# Patient Record
Sex: Female | Born: 1980 | Race: Black or African American | Hispanic: No | Marital: Single | Smoking: Current every day smoker
Health system: Southern US, Community
[De-identification: ages and names within clinical notes are randomized; demographics above are authoritative.]

## PROBLEM LIST (undated history)

## (undated) ENCOUNTER — Inpatient Hospital Stay (HOSPITAL_COMMUNITY): Payer: Self-pay

## (undated) DIAGNOSIS — A549 Gonococcal infection, unspecified: Secondary | ICD-10-CM

## (undated) DIAGNOSIS — A599 Trichomoniasis, unspecified: Secondary | ICD-10-CM

## (undated) DIAGNOSIS — A749 Chlamydial infection, unspecified: Secondary | ICD-10-CM

## (undated) DIAGNOSIS — Z789 Other specified health status: Secondary | ICD-10-CM

---
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
## 2001-08-27 ENCOUNTER — Encounter: Payer: Self-pay | Admitting: Emergency Medicine

## 2001-08-27 ENCOUNTER — Emergency Department (HOSPITAL_COMMUNITY): Admission: EM | Admit: 2001-08-27 | Discharge: 2001-08-27 | Payer: Self-pay | Admitting: Emergency Medicine

## 2003-03-10 ENCOUNTER — Emergency Department (HOSPITAL_COMMUNITY): Admission: EM | Admit: 2003-03-10 | Discharge: 2003-03-10 | Payer: Self-pay

## 2003-03-14 ENCOUNTER — Emergency Department (HOSPITAL_COMMUNITY): Admission: EM | Admit: 2003-03-14 | Discharge: 2003-03-14 | Payer: Self-pay | Admitting: Emergency Medicine

## 2003-07-17 ENCOUNTER — Inpatient Hospital Stay (HOSPITAL_COMMUNITY): Admission: AD | Admit: 2003-07-17 | Discharge: 2003-07-17 | Payer: Self-pay | Admitting: Obstetrics and Gynecology

## 2004-06-29 ENCOUNTER — Emergency Department (HOSPITAL_COMMUNITY): Admission: EM | Admit: 2004-06-29 | Discharge: 2004-06-29 | Payer: Self-pay | Admitting: Emergency Medicine

## 2004-08-24 ENCOUNTER — Inpatient Hospital Stay (HOSPITAL_COMMUNITY): Admission: AD | Admit: 2004-08-24 | Discharge: 2004-08-24 | Payer: Self-pay | Admitting: *Deleted

## 2005-03-21 ENCOUNTER — Emergency Department (HOSPITAL_COMMUNITY): Admission: EM | Admit: 2005-03-21 | Discharge: 2005-03-21 | Payer: Self-pay | Admitting: *Deleted

## 2007-05-09 ENCOUNTER — Emergency Department (HOSPITAL_COMMUNITY): Admission: EM | Admit: 2007-05-09 | Discharge: 2007-05-09 | Payer: Self-pay | Admitting: Emergency Medicine

## 2007-12-01 ENCOUNTER — Emergency Department (HOSPITAL_COMMUNITY): Admission: EM | Admit: 2007-12-01 | Discharge: 2007-12-01 | Payer: Self-pay | Admitting: *Deleted

## 2008-01-12 ENCOUNTER — Emergency Department (HOSPITAL_COMMUNITY): Admission: EM | Admit: 2008-01-12 | Discharge: 2008-01-12 | Payer: Self-pay | Admitting: Emergency Medicine

## 2008-05-23 ENCOUNTER — Emergency Department (HOSPITAL_COMMUNITY): Admission: EM | Admit: 2008-05-23 | Discharge: 2008-05-23 | Payer: Self-pay | Admitting: Emergency Medicine

## 2008-05-24 ENCOUNTER — Emergency Department (HOSPITAL_COMMUNITY): Admission: EM | Admit: 2008-05-24 | Discharge: 2008-05-24 | Payer: Self-pay | Admitting: Emergency Medicine

## 2010-05-29 ENCOUNTER — Inpatient Hospital Stay (HOSPITAL_COMMUNITY): Admission: AD | Admit: 2010-05-29 | Discharge: 2010-05-29 | Payer: Self-pay | Admitting: Obstetrics and Gynecology

## 2010-06-09 ENCOUNTER — Inpatient Hospital Stay (HOSPITAL_COMMUNITY): Admission: AD | Admit: 2010-06-09 | Discharge: 2010-06-09 | Payer: Self-pay | Admitting: Obstetrics & Gynecology

## 2010-06-20 ENCOUNTER — Inpatient Hospital Stay (HOSPITAL_COMMUNITY): Admission: AD | Admit: 2010-06-20 | Discharge: 2010-06-20 | Payer: Self-pay | Admitting: Obstetrics & Gynecology

## 2010-06-21 ENCOUNTER — Ambulatory Visit: Payer: Self-pay | Admitting: Nurse Practitioner

## 2010-06-21 ENCOUNTER — Inpatient Hospital Stay (HOSPITAL_COMMUNITY): Admission: AD | Admit: 2010-06-21 | Discharge: 2010-06-21 | Payer: Self-pay | Admitting: Obstetrics & Gynecology

## 2011-01-31 LAB — WET PREP, GENITAL: Yeast Wet Prep HPF POC: NONE SEEN

## 2011-01-31 LAB — URINALYSIS, ROUTINE W REFLEX MICROSCOPIC
Nitrite: NEGATIVE
Specific Gravity, Urine: >1.03 — ABNORMAL HIGH (ref 1.005–1.030)
Urobilinogen, UA: 0.2 mg/dL (ref 0.0–1.0)

## 2011-01-31 LAB — CBC
Hemoglobin: 12.6 g/dL (ref 12.0–15.0)
Platelets: 150 10*3/uL (ref 150–400)
RBC: 4.25 MIL/uL (ref 3.87–5.11)

## 2011-01-31 LAB — URINE MICROSCOPIC-ADD ON

## 2011-02-01 LAB — WET PREP, GENITAL
Clue Cells Wet Prep HPF POC: NONE SEEN
Trich, Wet Prep: NONE SEEN

## 2011-02-02 LAB — WET PREP, GENITAL

## 2011-04-12 ENCOUNTER — Inpatient Hospital Stay (HOSPITAL_COMMUNITY)
Admission: AD | Admit: 2011-04-12 | Discharge: 2011-04-12 | Disposition: A | Discharge status: Home / Self Care | Payer: Self-pay | Source: Ambulatory Visit | Attending: Obstetrics and Gynecology | Admitting: Obstetrics and Gynecology

## 2011-04-12 DIAGNOSIS — N915 Oligomenorrhea, unspecified: Secondary | ICD-10-CM

## 2011-04-12 LAB — URINALYSIS, ROUTINE W REFLEX MICROSCOPIC
Bilirubin Urine: NEGATIVE
Glucose, UA: NEGATIVE mg/dL
Protein, ur: NEGATIVE mg/dL
Specific Gravity, Urine: 1.015 (ref 1.005–1.030)
pH: 8 (ref 5.0–8.0)

## 2011-09-03 LAB — I-STAT 8, (EC8 V) (CONVERTED LAB)
Acid-base deficit: 2
Bicarbonate: 22
Chloride: 107
Glucose, Bld: 102 — ABNORMAL HIGH
Hemoglobin: 13.9
Operator id: 192351
Potassium: 3.5
TCO2: 23
pH, Ven: 7.42 — ABNORMAL HIGH

## 2011-09-03 LAB — DIFFERENTIAL
Eosinophils Relative: 1
Lymphocytes Relative: 53 — ABNORMAL HIGH
Lymphs Abs: 3.1
Monocytes Absolute: 0.4

## 2011-09-03 LAB — URINALYSIS, ROUTINE W REFLEX MICROSCOPIC
Bilirubin Urine: NEGATIVE
Hgb urine dipstick: NEGATIVE
Ketones, ur: NEGATIVE
Nitrite: NEGATIVE
Protein, ur: NEGATIVE
Specific Gravity, Urine: 1.036 — ABNORMAL HIGH
Urobilinogen, UA: 0.2

## 2011-09-03 LAB — POCT I-STAT CREATININE: Operator id: 192351

## 2011-09-03 LAB — CBC
HCT: 37.2
Hemoglobin: 12.1
MCV: 86.4
RBC: 4.31
RDW: 13.3
WBC: 5.8

## 2011-11-24 ENCOUNTER — Inpatient Hospital Stay (HOSPITAL_COMMUNITY): Payer: Medicaid Other

## 2011-11-24 ENCOUNTER — Inpatient Hospital Stay (HOSPITAL_COMMUNITY)
Admission: AD | Admit: 2011-11-24 | Discharge: 2011-11-24 | Disposition: A | Discharge status: Home / Self Care | Payer: Medicaid Other | Source: Ambulatory Visit | Attending: Obstetrics & Gynecology | Admitting: Obstetrics & Gynecology

## 2011-11-24 ENCOUNTER — Encounter (HOSPITAL_COMMUNITY): Payer: Self-pay | Admitting: *Deleted

## 2011-11-24 DIAGNOSIS — Z3490 Encounter for supervision of normal pregnancy, unspecified, unspecified trimester: Secondary | ICD-10-CM

## 2011-11-24 DIAGNOSIS — Z1389 Encounter for screening for other disorder: Secondary | ICD-10-CM

## 2011-11-24 DIAGNOSIS — O99891 Other specified diseases and conditions complicating pregnancy: Secondary | ICD-10-CM | POA: Insufficient documentation

## 2011-11-24 DIAGNOSIS — R109 Unspecified abdominal pain: Secondary | ICD-10-CM | POA: Insufficient documentation

## 2011-11-24 HISTORY — DX: Chlamydial infection, unspecified: A74.9

## 2011-11-24 HISTORY — DX: Trichomoniasis, unspecified: A59.9

## 2011-11-24 HISTORY — DX: Gonococcal infection, unspecified: A54.9

## 2011-11-24 LAB — URINALYSIS, ROUTINE W REFLEX MICROSCOPIC
Bilirubin Urine: NEGATIVE
Leukocytes, UA: NEGATIVE
Nitrite: NEGATIVE
Protein, ur: NEGATIVE mg/dL
Specific Gravity, Urine: 1.02 (ref 1.005–1.030)
Urobilinogen, UA: 1 mg/dL (ref 0.0–1.0)

## 2011-11-24 LAB — CBC
Hemoglobin: 12.1 g/dL (ref 12.0–15.0)
MCHC: 33.2 g/dL (ref 30.0–36.0)
MCV: 86.5 fL (ref 78.0–100.0)
RBC: 4.22 MIL/uL (ref 3.87–5.11)
WBC: 6.5 10*3/uL (ref 4.0–10.5)

## 2011-11-24 LAB — ABO/RH: ABO/RH(D): AB POS

## 2011-11-24 NOTE — Progress Notes (Signed)
+   UPT on Sat, was positive, wanting confirmation.

## 2011-11-24 NOTE — ED Provider Notes (Signed)
History     Chief Complaint  Patient presents with  . Possible Pregnancy   HPI 31 y.o. G1P0 at [redacted]w[redacted]d with low abd pain, no bleeding or discharge.    Past Medical History  Diagnosis Date  . Chlamydia   . Gonorrhea   . Trichomonas     Past Surgical History  Procedure Date  . No past surgeries     Family History  Problem Relation Age of Onset  . Anesthesia problems Neg Hx     History  Substance Use Topics  . Smoking status: Current Everyday Smoker -- 0.2 packs/day for 10 years  . Smokeless tobacco: Never Used  . Alcohol Use: Yes     occ    Allergies: No Known Allergies  No prescriptions prior to admission    Review of Systems  Constitutional: Negative.   Respiratory: Negative.   Cardiovascular: Negative.   Gastrointestinal: Positive for abdominal pain. Negative for nausea, vomiting, diarrhea and constipation.  Genitourinary: Negative for dysuria, urgency, frequency, hematuria and flank pain.       Negative for vaginal bleeding, vaginal discharge  Musculoskeletal: Negative.   Neurological: Negative.   Psychiatric/Behavioral: Negative.    Physical Exam   Blood pressure 115/73, pulse 83, temperature 99.5 F (37.5 C), temperature source Oral, resp. rate 18, height 5\' 1"  (1.549 m), weight 167 lb (75.751 kg), last menstrual period 10/18/2011.  Physical Exam  Nursing note and vitals reviewed. Constitutional: She is oriented to person, place, and time. She appears well-developed and well-nourished. No distress.  Cardiovascular: Normal rate.   Respiratory: Effort normal.  GI: Soft. There is no tenderness.  Musculoskeletal: Normal range of motion.  Neurological: She is alert and oriented to person, place, and time.  Skin: Skin is warm and dry.  Psychiatric: She has a normal mood and affect.    MAU Course  Procedures  Results for orders placed during the hospital encounter of 11/24/11 (from the past 48 hour(s))  URINALYSIS, ROUTINE W REFLEX MICROSCOPIC      Status: Normal   Collection Time   11/24/11  4:00 PM      Component Value Range Comment   Color, Urine YELLOW  YELLOW     APPearance CLEAR  CLEAR     Specific Gravity, Urine 1.020  1.005 - 1.030     pH 7.5  5.0 - 8.0     Glucose, UA NEGATIVE  NEGATIVE (mg/dL)    Hgb urine dipstick NEGATIVE  NEGATIVE     Bilirubin Urine NEGATIVE  NEGATIVE     Ketones, ur NEGATIVE  NEGATIVE (mg/dL)    Protein, ur NEGATIVE  NEGATIVE (mg/dL)    Urobilinogen, UA 1.0  0.0 - 1.0 (mg/dL)    Nitrite NEGATIVE  NEGATIVE     Leukocytes, UA NEGATIVE  NEGATIVE  MICROSCOPIC NOT DONE ON URINES WITH NEGATIVE PROTEIN, BLOOD, LEUKOCYTES, NITRITE, OR GLUCOSE <1000 mg/dL.  POCT PREGNANCY, URINE     Status: Normal   Collection Time   11/24/11  4:20 PM      Component Value Range Comment   Preg Test, Ur POSITIVE     CBC     Status: Normal   Collection Time   11/24/11  4:32 PM      Component Value Range Comment   WBC 6.5  4.0 - 10.5 (K/uL)    RBC 4.22  3.87 - 5.11 (MIL/uL)    Hemoglobin 12.1  12.0 - 15.0 (g/dL)    HCT 16.1  09.6 - 04.5 (%)  MCV 86.5  78.0 - 100.0 (fL)    MCH 28.7  26.0 - 34.0 (pg)    MCHC 33.2  30.0 - 36.0 (g/dL)    RDW 16.1  09.6 - 04.5 (%)    Platelets 167  150 - 400 (K/uL)   ABO/RH     Status: Normal   Collection Time   11/24/11  4:32 PM      Component Value Range Comment   ABO/RH(D) AB POS     HCG, QUANTITATIVE, PREGNANCY     Status: Abnormal   Collection Time   11/24/11  4:32 PM      Component Value Range Comment   hCG, Beta Chain, Quant, S 9990 (*) <5 (mIU/mL)     US Ob Comp Less 14 Wks  11/24/2011  *RADIOLOGY REPORT*  Clinical Data: Abdominal pain  OBSTETRIC <14 WK [place] AND TRANSVAGINAL OB US  Technique:  Both transabdominal and transvaginal ultrasound examinations were performed for complete evaluation of the gestation as well as the maternal uterus, adnexal regions, and pelvic cul-de-sac.  Transvaginal technique was performed to assess early pregnancy.  Comparison:  None.  Intrauterine  gestational sac:  Present and single Yolk sac: Present Embryo: Present Cardiac Activity: Identified Heart Rate: Not measurable  MSD: 9  mm  5         w 4     d  Maternal uterus/adnexae: Normal ovaries.  No subchorionic hemorrhage.  Trace free fluid.  IMPRESSION: Single uterine gestation with embryo and perceivable but not measurable cardiac activity.  Estimated gestational age by mean sac diameter equals 5 weeks 4 days.  Original Report Authenticated By: Genevive Bi, M.D.   US Ob Transvaginal  11/24/2011  *RADIOLOGY REPORT*  Clinical Data: Abdominal pain  OBSTETRIC <14 WK [place] AND TRANSVAGINAL OB US  Technique:  Both transabdominal and transvaginal ultrasound examinations were performed for complete evaluation of the gestation as well as the maternal uterus, adnexal regions, and pelvic cul-de-sac.  Transvaginal technique was performed to assess early pregnancy.  Comparison:  None.  Intrauterine gestational sac:  Present and single Yolk sac: Present Embryo: Present Cardiac Activity: Identified Heart Rate: Not measurable  MSD: 9  mm  5         w 4     d  Maternal uterus/adnexae: Normal ovaries.  No subchorionic hemorrhage.  Trace free fluid.  IMPRESSION: Single uterine gestation with embryo and perceivable but not measurable cardiac activity.  Estimated gestational age by mean sac diameter equals 5 weeks 4 days.  Original Report Authenticated By: Genevive Bi, M.D.   Pt declined pelvic exam.  Assessment and Plan  31 y.o. G1P0 at [redacted]w[redacted]d Start prenatal care ASAP Pregnancy verification and list of care providers given  [facility] 11/25/2011, 10:43 PM

## 2011-11-26 NOTE — ED Provider Notes (Signed)
Agree with above note.  [name] [name] 11/26/2011 8:52 AM

## 2012-01-28 ENCOUNTER — Other Ambulatory Visit: Payer: Self-pay

## 2012-01-28 ENCOUNTER — Encounter (HOSPITAL_COMMUNITY): Payer: Self-pay | Admitting: *Deleted

## 2012-01-28 ENCOUNTER — Inpatient Hospital Stay (HOSPITAL_COMMUNITY)
Admission: AD | Admit: 2012-01-28 | Discharge: 2012-01-28 | Disposition: A | Discharge status: Home / Self Care | Payer: Medicaid Other | Source: Ambulatory Visit | Attending: Obstetrics & Gynecology | Admitting: Obstetrics & Gynecology

## 2012-01-28 DIAGNOSIS — O219 Vomiting of pregnancy, unspecified: Secondary | ICD-10-CM

## 2012-01-28 DIAGNOSIS — R05 Cough: Secondary | ICD-10-CM | POA: Insufficient documentation

## 2012-01-28 DIAGNOSIS — O99891 Other specified diseases and conditions complicating pregnancy: Secondary | ICD-10-CM | POA: Insufficient documentation

## 2012-01-28 DIAGNOSIS — J40 Bronchitis, not specified as acute or chronic: Secondary | ICD-10-CM | POA: Insufficient documentation

## 2012-01-28 DIAGNOSIS — R51 Headache: Secondary | ICD-10-CM | POA: Insufficient documentation

## 2012-01-28 DIAGNOSIS — R059 Cough, unspecified: Secondary | ICD-10-CM | POA: Insufficient documentation

## 2012-01-28 DIAGNOSIS — O99330 Smoking (tobacco) complicating pregnancy, unspecified trimester: Secondary | ICD-10-CM

## 2012-01-28 DIAGNOSIS — J069 Acute upper respiratory infection, unspecified: Secondary | ICD-10-CM | POA: Insufficient documentation

## 2012-01-28 LAB — URINALYSIS, ROUTINE W REFLEX MICROSCOPIC
Bilirubin Urine: NEGATIVE
Glucose, UA: NEGATIVE mg/dL
Hgb urine dipstick: NEGATIVE
Ketones, ur: NEGATIVE mg/dL
Protein, ur: NEGATIVE mg/dL

## 2012-01-28 LAB — CBC
HCT: 32.8 % — ABNORMAL LOW (ref 36.0–46.0)
Hemoglobin: 10.9 g/dL — ABNORMAL LOW (ref 12.0–15.0)
MCH: 28.4 pg (ref 26.0–34.0)
MCHC: 33.2 g/dL (ref 30.0–36.0)
MCV: 85.4 fL (ref 78.0–100.0)

## 2012-01-28 MED ORDER — ALBUTEROL SULFATE HFA 108 (90 BASE) MCG/ACT IN AERS: 2.0000 | INHALATION_SPRAY | RESPIRATORY_TRACT | Status: DC

## 2012-01-28 MED ORDER — GUAIFENESIN ER 600 MG PO TB12: 1200.0000 mg | ORAL_TABLET | Freq: Two times a day (BID) | ORAL | Status: DC

## 2012-01-28 MED ORDER — LORATADINE 10 MG PO TABS: 10.0000 mg | ORAL_TABLET | Freq: Every day | ORAL | Status: DC

## 2012-01-28 NOTE — MAU Note (Signed)
Pt G1 at 14.4wks reports runny nose, cough and headache x 2 days.  Yesterday dizziness, today feels like heart was "skipping beats."

## 2012-01-28 NOTE — MAU Provider Note (Signed)
  History     CSN: [id number]  Arrival date and time: 01/28/12 2013   First Provider Initiated Contact with Patient 01/28/12 2051      Chief Complaint  Patient presents with  . Cough  . Headache  . Nasal Congestion  . Dizziness   HPI 31 y.o. G1P0 at [redacted]w[redacted]d. Cough and headache x 2 days, no fever. Dizzy yesterday, felt like heart was skipping beats today. Also c/o nausea and vomiting every night after getting off work.    Past Medical History  Diagnosis Date  . Chlamydia   . Gonorrhea   . Trichomonas     Past Surgical History  Procedure Date  . No past surgeries     Family History  Problem Relation Age of Onset  . Anesthesia problems Neg Hx     History  Substance Use Topics  . Smoking status: Current Everyday Smoker -- 0.2 packs/day for 10 years  . Smokeless tobacco: Never Used  . Alcohol Use: Yes     occ    Allergies: No Known Allergies  No prescriptions prior to admission    Review of Systems  Constitutional: Negative.  Negative for fever and chills.  HENT: Negative for sore throat.   Respiratory: Positive for cough and sputum production. Negative for shortness of breath and stridor.   Cardiovascular: Negative.  Negative for chest pain and palpitations.  Gastrointestinal: Negative for nausea, vomiting, abdominal pain, diarrhea and constipation.  Genitourinary: Negative for dysuria, urgency, frequency, hematuria and flank pain.       Negative for vaginal bleeding, vaginal discharge  Musculoskeletal: Negative.   Neurological: Positive for headaches. Negative for loss of consciousness.  Psychiatric/Behavioral: Negative.    Physical Exam   Blood pressure 116/72, pulse 69, temperature 97.4 F (36.3 C), resp. rate 16, height 5' (1.524 m), weight 169 lb (76.658 kg), last menstrual period 10/18/2011, SpO2 100.00%.  Physical Exam  Nursing note and vitals reviewed. Constitutional: She appears well-developed and well-nourished. No distress.  HENT:  Head:  Normocephalic and atraumatic.  Neck: Normal range of motion.  Cardiovascular: Normal rate and normal heart sounds.  A regularly irregular rhythm present. Exam reveals no gallop and no friction rub.   No murmur heard. Respiratory: Effort normal. No stridor. No respiratory distress. She has wheezes (mild expiratory, left lower lobe).  GI: Soft. There is no tenderness.  Lymphadenopathy:    She has no cervical adenopathy.    MAU Course  Procedures  EKG: normal EKG, normal sinus rhythm, sinus arrhythmia.   Assessment and Plan  31 y.o. G1P0 at [redacted]w[redacted]d URI/Bronchitis - Mucinex, Claritin, Albuterol inhaler prescribed, recommended smoking cessation Normal EKG with sinus arrhythmia  Has first prenatal appt with Dr. Gaynell Face on Monday, rev'd precautions, f/u as scheduled or PRN  [name] [name] 01/28/2012, 9:06 PM

## 2012-02-14 ENCOUNTER — Inpatient Hospital Stay (HOSPITAL_COMMUNITY)
Admission: AD | Admit: 2012-02-14 | Discharge: 2012-02-14 | Disposition: A | Discharge status: Home / Self Care | Payer: Medicaid Other | Source: Ambulatory Visit | Attending: Obstetrics & Gynecology | Admitting: Obstetrics & Gynecology

## 2012-02-14 ENCOUNTER — Encounter (HOSPITAL_COMMUNITY): Payer: Self-pay

## 2012-02-14 DIAGNOSIS — R51 Headache: Secondary | ICD-10-CM | POA: Insufficient documentation

## 2012-02-14 DIAGNOSIS — O210 Mild hyperemesis gravidarum: Secondary | ICD-10-CM | POA: Insufficient documentation

## 2012-02-14 DIAGNOSIS — O99891 Other specified diseases and conditions complicating pregnancy: Secondary | ICD-10-CM | POA: Insufficient documentation

## 2012-02-14 LAB — URINALYSIS, ROUTINE W REFLEX MICROSCOPIC
Glucose, UA: NEGATIVE mg/dL
Ketones, ur: NEGATIVE mg/dL
Leukocytes, UA: NEGATIVE
pH: 7.5 (ref 5.0–8.0)

## 2012-02-14 MED ORDER — DEXAMETHASONE SODIUM PHOSPHATE 10 MG/ML IJ SOLN
10.0000 mg | Freq: Once | INTRAMUSCULAR | Status: AC
  Administered 2012-02-14: 10 mg via INTRAVENOUS
  Filled 2012-02-14: qty 1

## 2012-02-14 MED ORDER — LACTATED RINGERS IV BOLUS (SEPSIS)
1000.0000 mL | Freq: Once | INTRAVENOUS | Status: AC
  Administered 2012-02-14: 1000 mL via INTRAVENOUS

## 2012-02-14 MED ORDER — BUTALBITAL-APAP-CAFFEINE 50-325-40 MG PO TABS: 1.0000 | ORAL_TABLET | ORAL | Status: DC | PRN

## 2012-02-14 MED ORDER — METOCLOPRAMIDE HCL 5 MG/ML IJ SOLN
10.0000 mg | Freq: Once | INTRAMUSCULAR | Status: AC
  Administered 2012-02-14: 10 mg via INTRAVENOUS
  Filled 2012-02-14: qty 2

## 2012-02-14 MED ORDER — DIPHENHYDRAMINE HCL 50 MG/ML IJ SOLN
25.0000 mg | Freq: Once | INTRAMUSCULAR | Status: AC
  Administered 2012-02-14: 25 mg via INTRAVENOUS
  Filled 2012-02-14: qty 1

## 2012-02-14 MED ORDER — BUTALBITAL-APAP-CAFFEINE 50-325-40 MG PO TABS: 1.0000 | ORAL_TABLET | ORAL | Status: AC | PRN

## 2012-02-14 NOTE — Discharge Instructions (Signed)

## 2012-02-14 NOTE — MAU Note (Signed)
Onset of headache since this morning [redacted] weeks gestation having headache daily, N/V once or twice a day.

## 2012-02-14 NOTE — ED Provider Notes (Signed)
First Provider Initiated Contact with Patient 02/14/12 1608      Chief Complaint:  Headache   [name] [name] is  31 y.o. G1P0 at [redacted]w[redacted]d presents complaining of Headache She gets a headache daily after vomiting in the morning and it usually goes away, but today it has not.  She has not taken any medicines for it.  It is the same HA she gets most days  Menstrual History: OB History    Grav Para Term Preterm Abortions TAB SAB Ect Mult Living   1              Patient's last menstrual period was 10/18/2011.     Past Medical History: Past Medical History  Diagnosis Date  . Chlamydia   . Gonorrhea   . Trichomonas     Past Surgical History: History reviewed. No pertinent past surgical history.  Family History: Family History  Problem Relation Age of Onset  . Anesthesia problems Neg Hx     Social History: History  Substance Use Topics  . Smoking status: Current Everyday Smoker -- 0.2 packs/day for 10 years  . Smokeless tobacco: Never Used  . Alcohol Use: No     occ    Allergies: No Known Allergies  Meds:  Prescriptions prior to admission  Medication Sig Dispense Refill  . albuterol (PROVENTIL HFA;VENTOLIN HFA) 108 (90 BASE) MCG/ACT inhaler Inhale 2 puffs into the lungs every 4 (four) hours.  1 Inhaler  0  . guaiFENesin (MUCINEX) 600 MG 12 hr tablet Take 2 tablets (1,200 mg total) by mouth 2 (two) times daily.  30 tablet  0  . loratadine (CLARITIN) 10 MG tablet Take 1 tablet (10 mg total) by mouth daily.  30 tablet  1    Review of Systems - Please refer to the aforementioned patients' reports.     Physical Exam  Blood pressure 107/69, pulse 89, temperature 98 F (36.7 C), resp. rate 16, height 5' (1.524 m), weight 75.66 kg (166 lb 12.8 oz), last menstrual period 10/18/2011. GENERAL: Well-developed, well-nourished female in no acute distress.  LUNGS: Clear to auscultation bilaterally.  HEART: Regular rate and rhythm. ABDOMEN: Soft, nontender, nondistended, gravid.    EXTREMITIES: Nontender, no edema, 2+ distal pulses. FHT confirmed in the 140's   Labs: Recent Results (from the past 24 hour(s))  URINALYSIS, ROUTINE W REFLEX MICROSCOPIC   Collection Time   02/14/12  3:50 PM      Component Value Range   Color, Urine YELLOW  YELLOW    APPearance HAZY (*) CLEAR    Specific Gravity, Urine 1.020  1.005 - 1.030    pH 7.5  5.0 - 8.0    Glucose, UA NEGATIVE  NEGATIVE (mg/dL)   Hgb urine dipstick NEGATIVE  NEGATIVE    Bilirubin Urine NEGATIVE  NEGATIVE    Ketones, ur NEGATIVE  NEGATIVE (mg/dL)   Protein, ur NEGATIVE  NEGATIVE (mg/dL)   Urobilinogen, UA 0.2  0.0 - 1.0 (mg/dL)   Nitrite NEGATIVE  NEGATIVE    Leukocytes, UA NEGATIVE  NEGATIVE    Imaging Studies:  No results found.  Assessment: [name] [name] is  31 y.o. G1P0 at [redacted]w[redacted]d presents with headache, resolved completely after meds/IVF  Plan: D/C home  [name],[name] Silveri 3/30/20134:08 PM

## 2012-02-14 NOTE — MAU Note (Signed)
[name] [name] at bedside with pt. Plan of care discussed.

## 2012-06-04 ENCOUNTER — Inpatient Hospital Stay (HOSPITAL_COMMUNITY)
Admission: AD | Admit: 2012-06-04 | Discharge: 2012-06-04 | Disposition: A | Discharge status: Home / Self Care | Payer: Medicaid Other | Source: Ambulatory Visit | Attending: Obstetrics | Admitting: Obstetrics

## 2012-06-04 ENCOUNTER — Encounter (HOSPITAL_COMMUNITY): Payer: Self-pay | Admitting: *Deleted

## 2012-06-04 DIAGNOSIS — N949 Unspecified condition associated with female genital organs and menstrual cycle: Secondary | ICD-10-CM | POA: Insufficient documentation

## 2012-06-04 DIAGNOSIS — O2390 Unspecified genitourinary tract infection in pregnancy, unspecified trimester: Secondary | ICD-10-CM | POA: Insufficient documentation

## 2012-06-04 DIAGNOSIS — A499 Bacterial infection, unspecified: Secondary | ICD-10-CM | POA: Insufficient documentation

## 2012-06-04 DIAGNOSIS — N760 Acute vaginitis: Secondary | ICD-10-CM | POA: Insufficient documentation

## 2012-06-04 DIAGNOSIS — B9689 Other specified bacterial agents as the cause of diseases classified elsewhere: Secondary | ICD-10-CM | POA: Insufficient documentation

## 2012-06-04 LAB — URINALYSIS, ROUTINE W REFLEX MICROSCOPIC
Bilirubin Urine: NEGATIVE
Nitrite: NEGATIVE
Specific Gravity, Urine: 1.02 (ref 1.005–1.030)
pH: 6 (ref 5.0–8.0)

## 2012-06-04 LAB — URINE MICROSCOPIC-ADD ON

## 2012-06-04 LAB — WET PREP, GENITAL

## 2012-06-04 MED ORDER — METRONIDAZOLE 500 MG PO TABS: 500.0000 mg | ORAL_TABLET | Freq: Two times a day (BID) | ORAL | Status: AC

## 2012-06-04 MED ORDER — FLUCONAZOLE 150 MG PO TABS: 150.0000 mg | ORAL_TABLET | Freq: Once | ORAL | Status: DC

## 2012-06-04 NOTE — MAU Provider Note (Signed)
History     CSN: [id number]  Arrival date and time: 06/04/12 1041   First Provider Initiated Contact with Patient 06/04/12 1150      Chief Complaint  Patient presents with  . Vaginal Pain   HPI  Patient states she has had vaginal irritation with burning when urine hits it. No bleeding, no discharge. Patient reports good fetal movement and no contractions.   Past Medical History  Diagnosis Date  . Chlamydia   . Gonorrhea   . Trichomonas     History reviewed. No pertinent past surgical history.  Family History  Problem Relation Age of Onset  . Anesthesia problems Neg Hx   . Diabetes Mother   . Heart disease Father     History  Substance Use Topics  . Smoking status: Current Everyday Smoker -- 0.2 packs/day for 10 years  . Smokeless tobacco: Never Used  . Alcohol Use: No     occ    Allergies: No Known Allergies  Prescriptions prior to admission  Medication Sig Dispense Refill  . acetaminophen-codeine (TYLENOL #3) 300-30 MG per tablet Take 1 tablet by mouth every 4 (four) hours as needed. For pain      . OVER THE COUNTER MEDICATION Take 1 tablet by mouth daily. Patient takes over the counter Iron      . Prenatal Vit-Fe Fumarate-FA (PRENATAL MULTIVITAMIN) TABS Take 1 tablet by mouth daily.        Review of Systems  Genitourinary: Positive for dysuria.       Vaginal irritation  All other systems reviewed and are negative.   Physical Exam   Blood pressure 102/64, pulse 88, temperature 99.1 F (37.3 C), temperature source Oral, resp. rate 16, height 4\' 11"  (1.499 m), weight 77.111 kg (170 lb), last menstrual period 10/18/2011, SpO2 100.00%.  Physical Exam  Constitutional: She is oriented to person, place, and time. She appears well-developed and well-nourished. No distress.  HENT:  Head: Normocephalic.  Neck: Normal range of motion. Neck supple.  Cardiovascular: Normal rate, regular rhythm and normal heart sounds.   Respiratory: Effort normal and breath  sounds normal.  Genitourinary: No bleeding around the vagina. Vaginal discharge found.       Vaginal redness; white discharge; cervix closed  Neurological: She is alert and oriented to person, place, and time.  Skin: Skin is warm and dry.    MAU Course  Procedures Results for orders placed during the hospital encounter of 06/04/12 (from the past 24 hour(s))  URINALYSIS, ROUTINE W REFLEX MICROSCOPIC     Status: Abnormal   Collection Time   06/04/12 10:55 AM      Component Value Range   Color, Urine YELLOW  YELLOW   APPearance HAZY (*) CLEAR   Specific Gravity, Urine 1.020  1.005 - 1.030   pH 6.0  5.0 - 8.0   Glucose, UA NEGATIVE  NEGATIVE mg/dL   Hgb urine dipstick NEGATIVE  NEGATIVE   Bilirubin Urine NEGATIVE  NEGATIVE   Ketones, ur NEGATIVE  NEGATIVE mg/dL   Protein, ur NEGATIVE  NEGATIVE mg/dL   Urobilinogen, UA 0.2  0.0 - 1.0 mg/dL   Nitrite NEGATIVE  NEGATIVE   Leukocytes, UA TRACE (*) NEGATIVE  URINE MICROSCOPIC-ADD ON     Status: Abnormal   Collection Time   06/04/12 10:55 AM      Component Value Range   Squamous Epithelial / LPF MANY (*) RARE   WBC, UA 0-2  <3 WBC/hpf   Urine-Other MUCOUS PRESENT  WET PREP, GENITAL     Status: Abnormal   Collection Time   06/04/12 12:00 PM      Component Value Range   Yeast Wet Prep HPF POC NONE SEEN  NONE SEEN   Trich, Wet Prep NONE SEEN  NONE SEEN   Clue Cells Wet Prep HPF POC MODERATE (*) NONE SEEN   WBC, Wet Prep HPF POC FEW (*) NONE SEEN   FHR 120's, +accels, reactive Toco - irritability  Assessment and Plan  Bacterial Vaginosis  Plan: DC to home RX Flagyl   [employer] 06/04/2012, 11:51 AM

## 2012-06-04 NOTE — MAU Note (Signed)
Patient states she has had vaginal irritation with burning when urine hits it. No bleeding, no discharge. Patient reports good fetal movement and no contractions.

## 2012-06-30 LAB — OB RESULTS CONSOLE GBS: GBS: NEGATIVE

## 2012-07-12 ENCOUNTER — Encounter (HOSPITAL_COMMUNITY): Payer: Self-pay | Admitting: *Deleted

## 2012-07-12 ENCOUNTER — Inpatient Hospital Stay (HOSPITAL_COMMUNITY)
Admission: AD | Admit: 2012-07-12 | Discharge: 2012-07-12 | Disposition: A | Discharge status: Home / Self Care | Payer: Medicaid Other | Source: Ambulatory Visit | Attending: Obstetrics | Admitting: Obstetrics

## 2012-07-12 DIAGNOSIS — O479 False labor, unspecified: Secondary | ICD-10-CM | POA: Insufficient documentation

## 2012-07-12 NOTE — MAU Note (Signed)
Contractions started this morning.  No bleeding or leaking.  No complications beyond anemia with preg.

## 2012-07-14 ENCOUNTER — Encounter (HOSPITAL_COMMUNITY): Payer: Self-pay | Admitting: Anesthesiology

## 2012-07-14 ENCOUNTER — Inpatient Hospital Stay (HOSPITAL_COMMUNITY)
Admission: AD | Admit: 2012-07-14 | Discharge: 2012-07-18 | DRG: 765 | Disposition: A | Discharge status: Home / Self Care | Payer: Medicaid Other | Source: Ambulatory Visit | Attending: Obstetrics | Admitting: Obstetrics

## 2012-07-14 ENCOUNTER — Encounter (HOSPITAL_COMMUNITY): Payer: Self-pay | Admitting: *Deleted

## 2012-07-14 DIAGNOSIS — Z98891 History of uterine scar from previous surgery: Secondary | ICD-10-CM

## 2012-07-14 DIAGNOSIS — O4590 Premature separation of placenta, unspecified, unspecified trimester: Secondary | ICD-10-CM | POA: Diagnosis present

## 2012-07-14 LAB — CBC
MCH: 28.2 pg (ref 26.0–34.0)
MCV: 86.6 fL (ref 78.0–100.0)
Platelets: 166 10*3/uL (ref 150–400)
RBC: 3.97 MIL/uL (ref 3.87–5.11)
RDW: 13.8 % (ref 11.5–15.5)

## 2012-07-14 LAB — OB RESULTS CONSOLE HIV ANTIBODY (ROUTINE TESTING): HIV: NONREACTIVE

## 2012-07-14 LAB — TYPE AND SCREEN

## 2012-07-14 MED ORDER — OXYCODONE-ACETAMINOPHEN 5-325 MG PO TABS: 1.0000 | ORAL_TABLET | ORAL | Status: DC | PRN

## 2012-07-14 MED ORDER — IBUPROFEN 600 MG PO TABS: 600.0000 mg | ORAL_TABLET | Freq: Four times a day (QID) | ORAL | Status: DC | PRN

## 2012-07-14 MED ORDER — EPHEDRINE 5 MG/ML INJ
10.0000 mg | INTRAVENOUS | Status: DC | PRN
  Filled 2012-07-14: qty 4

## 2012-07-14 MED ORDER — CITRIC ACID-SODIUM CITRATE 334-500 MG/5ML PO SOLN
30.0000 mL | ORAL | Status: DC | PRN
  Administered 2012-07-15: 30 mL via ORAL
  Filled 2012-07-14: qty 15

## 2012-07-14 MED ORDER — DIPHENHYDRAMINE HCL 50 MG/ML IJ SOLN: 12.5000 mg | INTRAMUSCULAR | Status: DC | PRN

## 2012-07-14 MED ORDER — EPHEDRINE 5 MG/ML INJ: 10.0000 mg | INTRAVENOUS | Status: DC | PRN

## 2012-07-14 MED ORDER — FENTANYL 2.5 MCG/ML BUPIVACAINE 1/10 % EPIDURAL INFUSION (WH - ANES)
14.0000 mL/h | INTRAMUSCULAR | Status: DC
  Administered 2012-07-15 (×4): 14 mL/h via EPIDURAL
  Filled 2012-07-14 (×5): qty 60

## 2012-07-14 MED ORDER — LACTATED RINGERS IV SOLN
500.0000 mL | INTRAVENOUS | Status: DC | PRN
  Administered 2012-07-14: 1000 mL via INTRAVENOUS

## 2012-07-14 MED ORDER — PHENYLEPHRINE 40 MCG/ML (10ML) SYRINGE FOR IV PUSH (FOR BLOOD PRESSURE SUPPORT): 80.0000 ug | PREFILLED_SYRINGE | INTRAVENOUS | Status: DC | PRN

## 2012-07-14 MED ORDER — FENTANYL 2.5 MCG/ML BUPIVACAINE 1/10 % EPIDURAL INFUSION (WH - ANES)
INTRAMUSCULAR | Status: DC | PRN
  Administered 2012-07-14: 12 mL/h via EPIDURAL

## 2012-07-14 MED ORDER — OXYTOCIN BOLUS FROM INFUSION
250.0000 mL | Freq: Once | INTRAVENOUS | Status: DC
  Filled 2012-07-14: qty 500

## 2012-07-14 MED ORDER — LIDOCAINE HCL (PF) 1 % IJ SOLN
INTRAMUSCULAR | Status: DC | PRN
  Administered 2012-07-14 (×2): 4 mL

## 2012-07-14 MED ORDER — TERBUTALINE SULFATE 1 MG/ML IJ SOLN
0.2500 mg | Freq: Once | INTRAMUSCULAR | Status: AC | PRN
  Filled 2012-07-14: qty 1

## 2012-07-14 MED ORDER — PHENYLEPHRINE 40 MCG/ML (10ML) SYRINGE FOR IV PUSH (FOR BLOOD PRESSURE SUPPORT)
80.0000 ug | PREFILLED_SYRINGE | INTRAVENOUS | Status: DC | PRN
  Filled 2012-07-14: qty 5

## 2012-07-14 MED ORDER — LIDOCAINE HCL (PF) 1 % IJ SOLN: 30.0000 mL | INTRAMUSCULAR | Status: DC | PRN

## 2012-07-14 MED ORDER — FLEET ENEMA 7-19 GM/118ML RE ENEM: 1.0000 | ENEMA | RECTAL | Status: DC | PRN

## 2012-07-14 MED ORDER — OXYTOCIN 40 UNITS IN LACTATED RINGERS INFUSION - SIMPLE MED: 62.5000 mL/h | Freq: Once | INTRAVENOUS | Status: DC

## 2012-07-14 MED ORDER — LACTATED RINGERS IV SOLN: 500.0000 mL | Freq: Once | INTRAVENOUS | Status: DC

## 2012-07-14 MED ORDER — ACETAMINOPHEN 325 MG PO TABS
650.0000 mg | ORAL_TABLET | ORAL | Status: DC | PRN
  Administered 2012-07-15 (×3): 650 mg via ORAL
  Filled 2012-07-14 (×3): qty 2

## 2012-07-14 MED ORDER — OXYTOCIN 40 UNITS IN LACTATED RINGERS INFUSION - SIMPLE MED
1.0000 m[IU]/min | INTRAVENOUS | Status: DC
  Filled 2012-07-14: qty 1000

## 2012-07-14 MED ORDER — BUTORPHANOL TARTRATE 1 MG/ML IJ SOLN
1.0000 mg | INTRAMUSCULAR | Status: DC | PRN
  Administered 2012-07-14: 1 mg via INTRAVENOUS
  Filled 2012-07-14: qty 1

## 2012-07-14 MED ORDER — LACTATED RINGERS IV SOLN
INTRAVENOUS | Status: DC
  Administered 2012-07-14 – 2012-07-15 (×3): via INTRAVENOUS

## 2012-07-14 MED ORDER — ONDANSETRON HCL 4 MG/2ML IJ SOLN
4.0000 mg | Freq: Four times a day (QID) | INTRAMUSCULAR | Status: DC | PRN
  Administered 2012-07-15 (×2): 4 mg via INTRAVENOUS
  Filled 2012-07-14 (×2): qty 2

## 2012-07-14 NOTE — H&P (Signed)
This is Dr. Francoise Ceo dictating the history and physical on [name] [name] she's a 31 year old gravida 1 at 30 weeks and 5 days EDC 07/24/2012 with ruptured membranes in labor cervix 3 cm 60% vertex -3 fluids clear she is on low-dose Pitocin was irregular contractions Past medical history negative Past surgical history negative Social history noncontributory System review negative Physical exam well-developed female in early labor HEENT negative Breasts negative Heart regular rhythm no murmurs no gallops Lungs clear to P&A abdomen Abdomen term Pelvic as described above Extremities negative and

## 2012-07-14 NOTE — MAU Note (Signed)
Pt C/O SROM @ 1730, clear fluid, has been having uc's all day, saw Dr. Gaynell Face today, was told she was closed.  Pt denies bleeding.

## 2012-07-14 NOTE — Anesthesia Preprocedure Evaluation (Addendum)
Anesthesia Evaluation  Patient identified by MRN, date of birth, ID band Patient awake    Reviewed: Allergy & Precautions, H&P , Patient's Chart, lab work & pertinent test results  Airway Mallampati: III TM Distance: >3 FB Neck ROM: full    Dental No notable dental hx. (+) Teeth Intact   Pulmonary neg pulmonary ROS,  breath sounds clear to auscultation  Pulmonary exam normal       Cardiovascular hypertension (PIH on magnesium), Rhythm:regular Rate:Normal     Neuro/Psych negative neurological ROS  negative psych ROS   GI/Hepatic negative GI ROS, Neg liver ROS,   Endo/Other  Morbid obesity  Renal/GU negative Renal ROS  negative genitourinary   Musculoskeletal   Abdominal Normal abdominal exam  (+)   Peds  Hematology negative hematology ROS (+)   Anesthesia Other Findings   Reproductive/Obstetrics (+) Pregnancy (FTp and possible abruption for c/s)                          Anesthesia Physical Anesthesia Plan  ASA: II and Emergent  Anesthesia Plan: Epidural   Post-op Pain Management:    Induction:   Airway Management Planned:   Additional Equipment:   Intra-op Plan:   Post-operative Plan:   Informed Consent: I have reviewed the patients History and Physical, chart, labs and discussed the procedure including the risks, benefits and alternatives for the proposed anesthesia with the patient or authorized representative who has indicated his/her understanding and acceptance.     Plan Discussed with: Anesthesiologist, Surgeon and CRNA  Anesthesia Plan Comments:        Anesthesia Quick Evaluation

## 2012-07-15 ENCOUNTER — Encounter (HOSPITAL_COMMUNITY)
Admission: AD | Disposition: A | Discharge status: Home / Self Care | Payer: Self-pay | Source: Ambulatory Visit | Attending: Obstetrics

## 2012-07-15 ENCOUNTER — Inpatient Hospital Stay (HOSPITAL_COMMUNITY): Payer: Medicaid Other | Admitting: Anesthesiology

## 2012-07-15 ENCOUNTER — Encounter (HOSPITAL_COMMUNITY): Payer: Self-pay | Admitting: Anesthesiology

## 2012-07-15 ENCOUNTER — Encounter (HOSPITAL_COMMUNITY): Payer: Self-pay | Admitting: *Deleted

## 2012-07-15 LAB — CBC
Hemoglobin: 10.3 g/dL — ABNORMAL LOW (ref 12.0–15.0)
MCH: 29.1 pg (ref 26.0–34.0)
MCV: 87 fL (ref 78.0–100.0)
RBC: 3.54 MIL/uL — ABNORMAL LOW (ref 3.87–5.11)

## 2012-07-15 SURGERY — Surgical Case
Anesthesia: Epidural | Site: Abdomen | Wound class: Clean Contaminated

## 2012-07-15 MED ORDER — CEFAZOLIN SODIUM-DEXTROSE 2-3 GM-% IV SOLR
2.0000 g | Freq: Once | INTRAVENOUS | Status: AC
  Administered 2012-07-15: 2 g via INTRAVENOUS

## 2012-07-15 MED ORDER — FENTANYL CITRATE 0.05 MG/ML IJ SOLN
25.0000 ug | INTRAMUSCULAR | Status: DC | PRN
  Administered 2012-07-15: 50 ug via INTRAVENOUS

## 2012-07-15 MED ORDER — NALOXONE HCL 0.4 MG/ML IJ SOLN: 0.4000 mg | INTRAMUSCULAR | Status: DC | PRN

## 2012-07-15 MED ORDER — ZOLPIDEM TARTRATE 5 MG PO TABS: 5.0000 mg | ORAL_TABLET | Freq: Every evening | ORAL | Status: DC | PRN

## 2012-07-15 MED ORDER — WITCH HAZEL-GLYCERIN EX PADS: 1.0000 "application " | MEDICATED_PAD | CUTANEOUS | Status: DC | PRN

## 2012-07-15 MED ORDER — MORPHINE SULFATE (PF) 0.5 MG/ML IJ SOLN
INTRAMUSCULAR | Status: DC | PRN
  Administered 2012-07-15: 1 mg via INTRAVENOUS
  Administered 2012-07-15: 4 mg via EPIDURAL

## 2012-07-15 MED ORDER — ONDANSETRON HCL 4 MG/2ML IJ SOLN
INTRAMUSCULAR | Status: AC
  Filled 2012-07-15: qty 2

## 2012-07-15 MED ORDER — SODIUM BICARBONATE 8.4 % IV SOLN
INTRAVENOUS | Status: DC | PRN
  Administered 2012-07-15: 5 mL via EPIDURAL

## 2012-07-15 MED ORDER — MEPERIDINE HCL 25 MG/ML IJ SOLN: 6.2500 mg | INTRAMUSCULAR | Status: DC | PRN

## 2012-07-15 MED ORDER — SCOPOLAMINE 1 MG/3DAYS TD PT72
1.0000 | MEDICATED_PATCH | Freq: Once | TRANSDERMAL | Status: DC
  Administered 2012-07-15: 1.5 mg via TRANSDERMAL

## 2012-07-15 MED ORDER — MEPERIDINE HCL 25 MG/ML IJ SOLN
INTRAMUSCULAR | Status: AC
  Filled 2012-07-15: qty 1

## 2012-07-15 MED ORDER — TETANUS-DIPHTH-ACELL PERTUSSIS 5-2.5-18.5 LF-MCG/0.5 IM SUSP
0.5000 mL | Freq: Once | INTRAMUSCULAR | Status: AC
  Administered 2012-07-16: 0.5 mL via INTRAMUSCULAR
  Filled 2012-07-15: qty 0.5

## 2012-07-15 MED ORDER — SENNOSIDES-DOCUSATE SODIUM 8.6-50 MG PO TABS
2.0000 | ORAL_TABLET | Freq: Every day | ORAL | Status: DC
  Administered 2012-07-16 – 2012-07-17 (×2): 2 via ORAL

## 2012-07-15 MED ORDER — SODIUM CHLORIDE 0.9 % IJ SOLN: 3.0000 mL | INTRAMUSCULAR | Status: DC | PRN

## 2012-07-15 MED ORDER — PHENYLEPHRINE 40 MCG/ML (10ML) SYRINGE FOR IV PUSH (FOR BLOOD PRESSURE SUPPORT)
PREFILLED_SYRINGE | INTRAVENOUS | Status: AC
  Filled 2012-07-15: qty 5

## 2012-07-15 MED ORDER — CEFAZOLIN SODIUM-DEXTROSE 2-3 GM-% IV SOLR
INTRAVENOUS | Status: AC
  Filled 2012-07-15: qty 50

## 2012-07-15 MED ORDER — IBUPROFEN 600 MG PO TABS: 600.0000 mg | ORAL_TABLET | Freq: Four times a day (QID) | ORAL | Status: DC | PRN

## 2012-07-15 MED ORDER — PRENATAL MULTIVITAMIN CH
1.0000 | ORAL_TABLET | Freq: Every day | ORAL | Status: DC
  Administered 2012-07-16 – 2012-07-18 (×3): 1 via ORAL
  Filled 2012-07-15 (×3): qty 1

## 2012-07-15 MED ORDER — SCOPOLAMINE 1 MG/3DAYS TD PT72
MEDICATED_PATCH | TRANSDERMAL | Status: AC
  Filled 2012-07-15: qty 1

## 2012-07-15 MED ORDER — ONDANSETRON HCL 4 MG PO TABS: 4.0000 mg | ORAL_TABLET | ORAL | Status: DC | PRN

## 2012-07-15 MED ORDER — DIPHENHYDRAMINE HCL 25 MG PO CAPS
25.0000 mg | ORAL_CAPSULE | ORAL | Status: DC | PRN
  Administered 2012-07-16: 25 mg via ORAL
  Filled 2012-07-15: qty 1

## 2012-07-15 MED ORDER — OXYTOCIN 40 UNITS IN LACTATED RINGERS INFUSION - SIMPLE MED
62.5000 mL/h | INTRAVENOUS | Status: DC
  Filled 2012-07-15: qty 1000

## 2012-07-15 MED ORDER — DIPHENHYDRAMINE HCL 50 MG/ML IJ SOLN: 12.5000 mg | INTRAMUSCULAR | Status: DC | PRN

## 2012-07-15 MED ORDER — KETOROLAC TROMETHAMINE 60 MG/2ML IM SOLN: 60.0000 mg | Freq: Once | INTRAMUSCULAR | Status: DC | PRN

## 2012-07-15 MED ORDER — KETOROLAC TROMETHAMINE 30 MG/ML IJ SOLN: 30.0000 mg | Freq: Four times a day (QID) | INTRAMUSCULAR | Status: AC | PRN

## 2012-07-15 MED ORDER — METOCLOPRAMIDE HCL 5 MG/ML IJ SOLN: 10.0000 mg | Freq: Three times a day (TID) | INTRAMUSCULAR | Status: DC | PRN

## 2012-07-15 MED ORDER — MAGNESIUM SULFATE BOLUS VIA INFUSION
4.0000 g | Freq: Once | INTRAVENOUS | Status: AC
  Administered 2012-07-15: 4 g via INTRAVENOUS
  Filled 2012-07-15: qty 500

## 2012-07-15 MED ORDER — MAGNESIUM SULFATE 40 G IN LACTATED RINGERS - SIMPLE: 2.0000 g/h | INTRAVENOUS | Status: DC

## 2012-07-15 MED ORDER — FENTANYL CITRATE 0.05 MG/ML IJ SOLN
INTRAMUSCULAR | Status: AC
  Filled 2012-07-15: qty 2

## 2012-07-15 MED ORDER — OXYTOCIN 10 UNIT/ML IJ SOLN
INTRAMUSCULAR | Status: AC
  Filled 2012-07-15: qty 4

## 2012-07-15 MED ORDER — NALBUPHINE HCL 10 MG/ML IJ SOLN
5.0000 mg | INTRAMUSCULAR | Status: DC | PRN
  Administered 2012-07-15 – 2012-07-16 (×2): 10 mg via INTRAVENOUS
  Filled 2012-07-15 (×2): qty 1

## 2012-07-15 MED ORDER — MAGNESIUM SULFATE 40 G IN LACTATED RINGERS - SIMPLE
2.0000 g/h | INTRAVENOUS | Status: DC
  Administered 2012-07-15: 2 g/h via INTRAVENOUS
  Filled 2012-07-15: qty 500

## 2012-07-15 MED ORDER — PHENYLEPHRINE HCL 10 MG/ML IJ SOLN
INTRAMUSCULAR | Status: DC | PRN
  Administered 2012-07-15: 40 ug via INTRAVENOUS

## 2012-07-15 MED ORDER — LACTATED RINGERS IV SOLN
INTRAVENOUS | Status: DC | PRN
  Administered 2012-07-15 (×2): via INTRAVENOUS

## 2012-07-15 MED ORDER — LANOLIN HYDROUS EX OINT: 1.0000 "application " | TOPICAL_OINTMENT | CUTANEOUS | Status: DC | PRN

## 2012-07-15 MED ORDER — MEPERIDINE HCL 25 MG/ML IJ SOLN
INTRAMUSCULAR | Status: DC | PRN
  Administered 2012-07-15: 25 mg via INTRAVENOUS

## 2012-07-15 MED ORDER — MENTHOL 3 MG MT LOZG: 1.0000 | LOZENGE | OROMUCOSAL | Status: DC | PRN

## 2012-07-15 MED ORDER — KETOROLAC TROMETHAMINE 60 MG/2ML IM SOLN
INTRAMUSCULAR | Status: AC
  Filled 2012-07-15: qty 2

## 2012-07-15 MED ORDER — ONDANSETRON HCL 4 MG/2ML IJ SOLN
INTRAMUSCULAR | Status: DC | PRN
  Administered 2012-07-15: 4 mg via INTRAVENOUS

## 2012-07-15 MED ORDER — DIPHENHYDRAMINE HCL 50 MG/ML IJ SOLN: 25.0000 mg | INTRAMUSCULAR | Status: DC | PRN

## 2012-07-15 MED ORDER — ONDANSETRON HCL 4 MG/2ML IJ SOLN: 4.0000 mg | INTRAMUSCULAR | Status: DC | PRN

## 2012-07-15 MED ORDER — SODIUM CHLORIDE 0.9 % IV SOLN: 1.0000 ug/kg/h | INTRAVENOUS | Status: DC | PRN

## 2012-07-15 MED ORDER — SIMETHICONE 80 MG PO CHEW
80.0000 mg | CHEWABLE_TABLET | Freq: Three times a day (TID) | ORAL | Status: DC
  Administered 2012-07-16 – 2012-07-18 (×7): 80 mg via ORAL

## 2012-07-15 MED ORDER — SODIUM BICARBONATE 8.4 % IV SOLN
INTRAVENOUS | Status: AC
  Filled 2012-07-15: qty 50

## 2012-07-15 MED ORDER — LIDOCAINE-EPINEPHRINE (PF) 2 %-1:200000 IJ SOLN
INTRAMUSCULAR | Status: AC
  Filled 2012-07-15: qty 20

## 2012-07-15 MED ORDER — OXYCODONE-ACETAMINOPHEN 5-325 MG PO TABS
1.0000 | ORAL_TABLET | ORAL | Status: DC | PRN
  Administered 2012-07-17 (×2): 1 via ORAL
  Administered 2012-07-18 (×3): 2 via ORAL
  Filled 2012-07-15: qty 1
  Filled 2012-07-15 (×3): qty 2
  Filled 2012-07-15: qty 1

## 2012-07-15 MED ORDER — NALBUPHINE HCL 10 MG/ML IJ SOLN
5.0000 mg | INTRAMUSCULAR | Status: DC | PRN
  Administered 2012-07-16: 10 mg via SUBCUTANEOUS
  Filled 2012-07-15: qty 1

## 2012-07-15 MED ORDER — LACTATED RINGERS IV SOLN
INTRAVENOUS | Status: DC
  Administered 2012-07-15: 999 mL/h via INTRAUTERINE
  Administered 2012-07-15: 19:00:00 via INTRAUTERINE

## 2012-07-15 MED ORDER — IBUPROFEN 600 MG PO TABS
600.0000 mg | ORAL_TABLET | Freq: Four times a day (QID) | ORAL | Status: DC
  Administered 2012-07-16 – 2012-07-18 (×10): 600 mg via ORAL
  Filled 2012-07-15 (×10): qty 1

## 2012-07-15 MED ORDER — LACTATED RINGERS IV SOLN
INTRAVENOUS | Status: DC
  Administered 2012-07-15: 1000 mL via INTRAVENOUS

## 2012-07-15 MED ORDER — ONDANSETRON HCL 4 MG/2ML IJ SOLN
4.0000 mg | Freq: Three times a day (TID) | INTRAMUSCULAR | Status: DC | PRN
Start: 2012-07-15 — End: 2012-07-18

## 2012-07-15 MED ORDER — DIPHENHYDRAMINE HCL 25 MG PO CAPS: 25.0000 mg | ORAL_CAPSULE | Freq: Four times a day (QID) | ORAL | Status: DC | PRN

## 2012-07-15 MED ORDER — SIMETHICONE 80 MG PO CHEW: 80.0000 mg | CHEWABLE_TABLET | ORAL | Status: DC | PRN

## 2012-07-15 MED ORDER — DIBUCAINE 1 % RE OINT: 1.0000 "application " | TOPICAL_OINTMENT | RECTAL | Status: DC | PRN

## 2012-07-15 MED ORDER — OXYTOCIN 10 UNIT/ML IJ SOLN
40.0000 [IU] | INTRAVENOUS | Status: DC | PRN
  Administered 2012-07-15: 40 [IU] via INTRAVENOUS

## 2012-07-15 MED ORDER — MORPHINE SULFATE 0.5 MG/ML IJ SOLN
INTRAMUSCULAR | Status: AC
  Filled 2012-07-15: qty 10

## 2012-07-15 SURGICAL SUPPLY — 33 items
ADH SKN CLS APL DERMABOND .7 (GAUZE/BANDAGES/DRESSINGS)
CLOTH BEACON ORANGE TIMEOUT ST (SAFETY) ×2 IMPLANT
DERMABOND ADVANCED (GAUZE/BANDAGES/DRESSINGS)
DERMABOND ADVANCED .7 DNX12 (GAUZE/BANDAGES/DRESSINGS) ×1 IMPLANT
DRESSING TELFA 8X3 (GAUZE/BANDAGES/DRESSINGS) ×1 IMPLANT
DRSG COVADERM 4X10 (GAUZE/BANDAGES/DRESSINGS) ×1 IMPLANT
ELECT REM PT RETURN 9FT ADLT (ELECTROSURGICAL) ×2
ELECTRODE REM PT RTRN 9FT ADLT (ELECTROSURGICAL) ×1 IMPLANT
EXTRACTOR VACUUM M CUP 4 TUBE (SUCTIONS) IMPLANT
GLOVE BIO SURGEON STRL SZ8.5 (GLOVE) ×4 IMPLANT
GOWN PREVENTION PLUS LG XLONG (DISPOSABLE) ×4 IMPLANT
GOWN PREVENTION PLUS XXLARGE (GOWN DISPOSABLE) ×2 IMPLANT
KIT ABG SYR 3ML LUER SLIP (SYRINGE) IMPLANT
NDL HYPO 25X5/8 SAFETYGLIDE (NEEDLE) ×1 IMPLANT
NEEDLE HYPO 25X5/8 SAFETYGLIDE (NEEDLE) IMPLANT
NS IRRIG 1000ML POUR BTL (IV SOLUTION) ×2 IMPLANT
PACK C SECTION WH (CUSTOM PROCEDURE TRAY) ×2 IMPLANT
PAD ABD 7.5X8 STRL (GAUZE/BANDAGES/DRESSINGS) ×1 IMPLANT
PAD OB MATERNITY 4.3X12.25 (PERSONAL CARE ITEMS) IMPLANT
SLEEVE SCD COMPRESS KNEE MED (MISCELLANEOUS) ×1 IMPLANT
SUT CHROMIC 0 CT 802H (SUTURE) ×2 IMPLANT
SUT CHROMIC 1 CTX 36 (SUTURE) ×4 IMPLANT
SUT CHROMIC 2 0 SH (SUTURE) ×2 IMPLANT
SUT GUT PLAIN 0 CT-3 TAN 27 (SUTURE) IMPLANT
SUT MON AB 4-0 PS1 27 (SUTURE) ×2 IMPLANT
SUT VIC AB 0 CT1 18XCR BRD8 (SUTURE) IMPLANT
SUT VIC AB 0 CT1 8-18 (SUTURE)
SUT VIC AB 0 CTX 36 (SUTURE) ×4
SUT VIC AB 0 CTX36XBRD ANBCTRL (SUTURE) ×2 IMPLANT
TAPE CLOTH SURG 4X10 WHT LF (GAUZE/BANDAGES/DRESSINGS) ×1 IMPLANT
TOWEL OR 17X24 6PK STRL BLUE (TOWEL DISPOSABLE) ×4 IMPLANT
TRAY FOLEY CATH 14FR (SET/KITS/TRAYS/PACK) ×2 IMPLANT
WATER STERILE IRR 1000ML POUR (IV SOLUTION) ×1 IMPLANT

## 2012-07-15 NOTE — Op Note (Signed)
preop diagnosis failure to progress in labor and suspected abruption Postop diagnosis same Anesthesia epidural Surgeon Dr. Francoise Ceo  patient placed on the operating table in the supine position abdomen prepped and draped bladder and to the Foley catheter a transverse suprapubic incision made carried down to the rectus fascia fascia cleaned and incised the length of the incision the recti muscles retracted laterally peritoneum incised longitudinally transverse incision made in the visceroperitoneum above the bladder and the bladder mobilized inferiorly a transverse low uterine incision made and it was noted that there were a few clots in the uterus shows delivered from the OP position of a female Apgar team was in attendance the placenta was posterior and there was a small abruption placenta was sent to pathology uterine cavity and closed in cleaned with dry laps the uterine incision closed in one layer with continuous looped abnormal one chromic hemostasis was satisfactory bladder flap reattached to a chromic uterus well contracted tubes and ovaries normal abdomen closed in layers peritoneum continuous with 2-0 chromic fascia continuous with of 0 Dexon and the skin closed with staples blood loss was 700 cc and

## 2012-07-15 NOTE — Progress Notes (Signed)
Patient ID: [name] [name], female   DOB: [birth year], 31 y.o.   MRN: [id number] Tracing looks better so will withhold amnioinfusion  Until necessary necessary

## 2012-07-15 NOTE — Anesthesia Postprocedure Evaluation (Signed)
  Anesthesia Post-op Note  Patient: [name] [name]  Procedure(s) Performed: Procedure(s) (LRB): CESAREAN SECTION (N/A)  Patient Location: PACU  Anesthesia Type: Epidural  Level of Consciousness: awake, alert  and oriented  Airway and Oxygen Therapy: Patient Spontanous Breathing  Post-op Pain: mild  Post-op Assessment: Post-op Vital signs reviewed, Patient's Cardiovascular Status Stable, Respiratory Function Stable, Patent Airway, No signs of Nausea or vomiting, Pain level controlled, No headache, No backache, No residual numbness and No residual motor weakness  Post-op Vital Signs: Reviewed and stable  Complications: No apparent anesthesia complications

## 2012-07-15 NOTE — Progress Notes (Signed)
Patient ID: [name] [name], female   DOB: Mar 29, 1981, 31 y.o.   MRN: [id number] Patient has been having some mild variable decelerations with contractions cervix 3 cm 70% some IUPC was inserted and if necessary and amnioinfusion  Will be performed patient     has her epidural in

## 2012-07-15 NOTE — Anesthesia Procedure Notes (Signed)
Epidural Patient location during procedure: OB Start time: 07/14/2012 11:37 PM  Staffing Anesthesiologist: Hilliard Borges A. Performed by: anesthesiologist   Preanesthetic Checklist Completed: patient identified, site marked, surgical consent, pre-op evaluation, timeout performed, IV checked, risks and benefits discussed and monitors and equipment checked  Epidural Patient position: sitting Prep: site prepped and draped and DuraPrep Patient monitoring: continuous pulse ox and blood pressure Approach: midline Injection technique: LOR air  Needle:  Needle type: Tuohy  Needle gauge: 17 G Needle length: 9 cm and 9 Needle insertion depth: 6 and 6 cm Catheter type: closed end flexible Catheter size: 19 Gauge Catheter at skin depth: 11 cm Test dose: negative and Other  Assessment Events: blood not aspirated, injection not painful, no injection resistance, negative IV test and no paresthesia  Additional Notes Patient identified. Risks and benefits discussed including failed block, incomplete  Pain control, post dural puncture headache, nerve damage, paralysis, blood pressure Changes, nausea, vomiting, reactions to medications-both toxic and allergic and post Partum back pain. All questions were answered. Patient expressed understanding and wished to proceed. Sterile technique was used throughout procedure. Epidural site was Dressed with sterile barrier dressing. No paresthesias, signs of intravascular injection Or signs of intrathecal spread were encountered.  Patient was more comfortable after the epidural was dosed. Please see RN's note for documentation of vital signs and FHR which are stable.

## 2012-07-15 NOTE — Transfer of Care (Signed)
Immediate Anesthesia Transfer of Care Note  Patient: [name] [name]  Procedure(s) Performed: Procedure(s) (LRB): CESAREAN SECTION (N/A)  Patient Location: PACU  Anesthesia Type: Epidural  Level of Consciousness: awake, alert  and oriented  Airway & Oxygen Therapy: Patient Spontanous Breathing  Post-op Assessment: Report given to PACU RN and Post -op Vital signs reviewed and stable  Post vital signs: Reviewed and stable  Complications: No apparent anesthesia complications

## 2012-07-15 NOTE — Progress Notes (Signed)
Patient ID: [name] [name], female   DOB: 1981/05/05, 31 y.o.   MRN: [id number] Patient's blood pressure has become elevated and she was started on magnesium sulfate 4 g loading and 2 g per hour also her uterus is no become rigid and her bleeding not increased but she's complaining of pain with her epidural and her cervix is unchanged 4 cm vertex -2-3 suspect possible abruption she's been in adequate labor all day she'll be delivered by C-section

## 2012-07-16 ENCOUNTER — Encounter (HOSPITAL_COMMUNITY): Payer: Self-pay | Admitting: Obstetrics

## 2012-07-16 LAB — CBC
MCH: 28.7 pg (ref 26.0–34.0)
MCHC: 33 g/dL (ref 30.0–36.0)
Platelets: 129 10*3/uL — ABNORMAL LOW (ref 150–400)
RBC: 3.28 MIL/uL — ABNORMAL LOW (ref 3.87–5.11)

## 2012-07-16 LAB — MRSA PCR SCREENING: MRSA by PCR: NEGATIVE

## 2012-07-16 MED ORDER — PNEUMOCOCCAL VAC POLYVALENT 25 MCG/0.5ML IJ INJ
0.5000 mL | INJECTION | INTRAMUSCULAR | Status: AC
  Administered 2012-07-17: 0.5 mL via INTRAMUSCULAR
  Filled 2012-07-16: qty 0.5

## 2012-07-16 NOTE — Progress Notes (Signed)
UR chart review completed.  

## 2012-07-16 NOTE — Progress Notes (Signed)
Patient ID: [name] [name], female   DOB: Nov 13, 1981, 31 y.o.   MRN: [id number] Postop day 1 Vital signs normal Incision clean and dry Legs negative Discontinue magnesium sulfate and transferred today

## 2012-07-16 NOTE — Anesthesia Postprocedure Evaluation (Signed)
  Anesthesia Post-op Note  Patient: [name] [name]  Procedure(s) Performed: Procedure(s) (LRB): CESAREAN SECTION (N/A)  Patient Location: [facility]  Anesthesia Type: Epidural  Level of Consciousness: awake  Airway and Oxygen Therapy: Patient Spontanous Breathing  Post-op Pain: none  Post-op Assessment: Patient's Cardiovascular Status Stable, Respiratory Function Stable, Patent Airway, No signs of Nausea or vomiting, Adequate PO intake, Pain level controlled, No headache, No backache, No residual numbness and No residual motor weakness  Post-op Vital Signs: Reviewed and stable  Complications: No apparent anesthesia complications

## 2012-07-16 NOTE — Addendum Note (Signed)
Addendum  created 07/16/12 0856 by Suella Grove, CRNA   Modules edited:Notes Section

## 2012-07-16 NOTE — Progress Notes (Signed)
D/C mag and transfer patient verbal order with per Dr. Gaynell Face.

## 2012-07-17 NOTE — Progress Notes (Signed)
Subjective: Postpartum Day 2: Cesarean Delivery Patient reports tolerating PO, + flatus and no problems voiding.    Objective: Vital signs in last 24 hours: Temp:  [98 F (36.7 C)-98.5 F (36.9 C)] 98 F (36.7 C) (08/31 0806) Pulse Rate:  [64-92] 82  (08/31 0806) Resp:  [18-20] 18  (08/31 0806) BP: (101-126)/(56-82) 104/65 mmHg (08/31 0806) SpO2:  [97 %-100 %] 99 % (08/31 0806)  Physical Exam:  General: alert and no distress Lochia: appropriate Uterine Fundus: firm Incision: healing well DVT Evaluation: No evidence of DVT seen on physical exam.   Basename 07/16/12 0530 07/15/12 2002  HGB 9.4* 10.3*  HCT 28.5* 30.8*    Assessment/Plan: Status post Cesarean section. Doing well postoperatively.  Continue current care.  HARPER,CHARLES A 07/17/2012, 10:27 AM

## 2012-07-18 MED ORDER — FUSION PLUS PO CAPS: 1.0000 | ORAL_CAPSULE | Freq: Every day | ORAL | Status: DC

## 2012-07-18 MED ORDER — OXYCODONE-ACETAMINOPHEN 5-325 MG PO TABS: 1.0000 | ORAL_TABLET | ORAL | Status: AC | PRN

## 2012-07-18 MED ORDER — IBUPROFEN 600 MG PO TABS: 600.0000 mg | ORAL_TABLET | Freq: Four times a day (QID) | ORAL | Status: DC

## 2012-07-18 NOTE — Discharge Summary (Signed)
Obstetric Discharge Summary Reason for Admission: rupture of membranes Prenatal Procedures: ultrasound Intrapartum Procedures: cesarean: low cervical, transverse Postpartum Procedures: none Complications-Operative and Postpartum: none Hemoglobin  Date Value Range Status  07/16/2012 9.4* 12.0 - 15.0 g/dL Final     HCT  Date Value Range Status  07/16/2012 28.5* 36.0 - 46.0 % Final    Physical Exam:  General: alert and no distress Lochia: appropriate Uterine Fundus: firm Incision: healing well DVT Evaluation: No evidence of DVT seen on physical exam.  Discharge Diagnoses: Term Pregnancy-delivered  Discharge Information: Date: 07/18/2012 Activity: pelvic rest Diet: routine Medications: PNV, Ibuprofen, Colace, Iron and Percocet Condition: stable Instructions: refer to practice specific booklet Discharge to: home Follow-up Information    Follow up with MARSHALL,BERNARD A, MD. Schedule an appointment as soon as possible for a visit in 2 weeks.   Contact information:   [address] [place] [address] [phone]          Newborn Data: Live born female  Birth Weight: 7 lb 0.9 oz (3200 g) APGAR: 4, 7  Home with mother.  Christifer Chapdelaine A 07/18/2012, 11:37 AM

## 2012-07-18 NOTE — Progress Notes (Signed)
Subjective: Postpartum Day 3: Cesarean Delivery Patient reports tolerating PO, + flatus, + BM and no problems voiding.    Objective: Vital signs in last 24 hours: Temp:  [97.6 F (36.4 C)-98.3 F (36.8 C)] 97.6 F (36.4 C) (09/01 0605) Pulse Rate:  [74-85] 74  (09/01 0605) Resp:  [18] 18  (09/01 0605) BP: (107-113)/(64-73) 110/73 mmHg (09/01 0605) SpO2:  [98 %] 98 % (08/31 2053)  Physical Exam:  General: alert and no distress Lochia: appropriate Uterine Fundus: firm Incision: healing well DVT Evaluation: No evidence of DVT seen on physical exam.   Basename 07/16/12 0530 07/15/12 2002  HGB 9.4* 10.3*  HCT 28.5* 30.8*    Assessment/Plan: Status post Cesarean section. Doing well postoperatively.  Discharge home with standard precautions and return to clinic in 2 weeks.  Jayjay Littles A 07/18/2012, 11:31 AM

## 2012-07-22 ENCOUNTER — Encounter (HOSPITAL_COMMUNITY): Payer: Self-pay | Admitting: *Deleted

## 2012-11-16 ENCOUNTER — Inpatient Hospital Stay (HOSPITAL_COMMUNITY)
Admission: AD | Admit: 2012-11-16 | Discharge: 2012-11-16 | Disposition: A | Discharge status: Home / Self Care | Payer: Self-pay | Source: Ambulatory Visit | Attending: Obstetrics & Gynecology | Admitting: Obstetrics & Gynecology

## 2012-11-16 DIAGNOSIS — N949 Unspecified condition associated with female genital organs and menstrual cycle: Secondary | ICD-10-CM | POA: Insufficient documentation

## 2012-11-16 DIAGNOSIS — R1033 Periumbilical pain: Secondary | ICD-10-CM | POA: Insufficient documentation

## 2012-11-16 LAB — URINALYSIS, ROUTINE W REFLEX MICROSCOPIC
Bilirubin Urine: NEGATIVE
Nitrite: NEGATIVE
Specific Gravity, Urine: >1.03 — ABNORMAL HIGH (ref 1.005–1.030)
Urobilinogen, UA: 0.2 mg/dL (ref 0.0–1.0)

## 2012-11-16 LAB — URINE MICROSCOPIC-ADD ON

## 2012-11-16 NOTE — MAU Note (Signed)
Patient states she has had pain in her umbilical area for a couple of weeks. Hurts more with palpation or movement. Patient had a c/s 4 months ago. Light brown discharge.

## 2012-11-16 NOTE — MAU Note (Signed)
Pt. Not in lobby 

## 2012-11-16 NOTE — MAU Note (Signed)
Pt not in lobby.  

## 2012-11-16 NOTE — MAU Note (Signed)
Not in lobby

## 2013-03-03 ENCOUNTER — Encounter (HOSPITAL_COMMUNITY): Payer: Self-pay

## 2013-03-03 ENCOUNTER — Emergency Department (HOSPITAL_COMMUNITY)
Admission: EM | Admit: 2013-03-03 | Discharge: 2013-03-03 | Disposition: A | Discharge status: Home / Self Care | Payer: Self-pay | Attending: Emergency Medicine | Admitting: Emergency Medicine

## 2013-03-03 DIAGNOSIS — R109 Unspecified abdominal pain: Secondary | ICD-10-CM | POA: Insufficient documentation

## 2013-03-03 DIAGNOSIS — Z8619 Personal history of other infectious and parasitic diseases: Secondary | ICD-10-CM | POA: Insufficient documentation

## 2013-03-03 DIAGNOSIS — K602 Anal fissure, unspecified: Secondary | ICD-10-CM

## 2013-03-03 DIAGNOSIS — F17200 Nicotine dependence, unspecified, uncomplicated: Secondary | ICD-10-CM | POA: Insufficient documentation

## 2013-03-03 MED ORDER — LIDOCAINE HCL 2 % EX GEL: CUTANEOUS | Status: DC | PRN

## 2013-03-03 MED ORDER — PSYLLIUM 28 % PO PACK: 1.0000 | PACK | Freq: Two times a day (BID) | ORAL | Status: DC

## 2013-03-03 MED ORDER — NAPROXEN 375 MG PO TABS: 375.0000 mg | ORAL_TABLET | Freq: Two times a day (BID) | ORAL | Status: DC

## 2013-03-03 MED ORDER — KETOROLAC TROMETHAMINE 30 MG/ML IJ SOLN
30.0000 mg | Freq: Once | INTRAMUSCULAR | Status: AC
  Administered 2013-03-03: 30 mg via INTRAMUSCULAR
  Filled 2013-03-03: qty 1

## 2013-03-03 MED ORDER — HYDROMORPHONE HCL PF 1 MG/ML IJ SOLN
1.0000 mg | Freq: Once | INTRAMUSCULAR | Status: AC
  Administered 2013-03-03: 1 mg via INTRAMUSCULAR
  Filled 2013-03-03: qty 1

## 2013-03-03 NOTE — ED Notes (Signed)
MD at bedside. Dr Kohut 

## 2013-03-03 NOTE — ED Notes (Addendum)
Pt endorses she tried to use the bathroom last night and noticed blood around stool and blood on toilet tissue. Pt states she does not feel constipated. Last BM prior to last night was the day prior. Pt states she uses the restroom, multiple times a day. Denies N/V/D.

## 2013-03-08 NOTE — ED Provider Notes (Signed)
History    31yf with rectal pain. Onset last night. Associated with small amount of rectal bleeding noted when she wipes. Severe pain with bowel movement. Pain starts during BM and continues for about up to an hour after. Gradual improves to mild ache. No abdominal pain. No fever or chills. No use of blood thinners. No hx of similar symptoms.  CSN: [id number]  Arrival date & time 03/03/13  0905   First MD Initiated Contact with Patient 03/03/13 0913      Chief Complaint  Patient presents with  . Rectal Bleeding  . Abdominal Pain    (Consider location/radiation/quality/duration/timing/severity/associated sxs/prior treatment) HPI  Past Medical History  Diagnosis Date  . Chlamydia   . Gonorrhea   . Trichomonas     Past Surgical History  Procedure Laterality Date  . Cesarean section  07/15/2012    Procedure: CESAREAN SECTION;  Surgeon: Kathreen Cosier, MD;  Location: [facility];  Service: Gynecology;  Laterality: N/A;    Family History  Problem Relation Age of Onset  . Anesthesia problems Neg Hx   . Diabetes Mother   . Heart disease Father     History  Substance Use Topics  . Smoking status: Current Every Day Smoker -- 0.25 packs/day for 10 years  . Smokeless tobacco: Never Used  . Alcohol Use: No     Comment: occ    OB History   Grav Para Term Preterm Abortions TAB SAB Ect Mult Living   1 1 1  0 0 0 0 0 0 1      Review of Systems  All systems reviewed and negative, other than as noted in HPI.   Allergies  Review of patient's allergies indicates no known allergies.  Home Medications   Current Outpatient Rx  Name  Route  Sig  Dispense  Refill  . lidocaine (XYLOCAINE JELLY) 2 % jelly   Topical   Apply topically as needed.   30 mL   0   . naproxen (NAPROSYN) 375 MG tablet   Oral   Take 1 tablet (375 mg total) by mouth 2 (two) times daily.   20 tablet   0   . psyllium (METAMUCIL SMOOTH TEXTURE) 28 % packet   Oral   Take 1 packet by mouth 2 (two)  times daily.   30 packet   0     BP 131/89  Pulse 80  Temp(Src) 98.5 F (36.9 C) (Oral)  Resp 20  SpO2 100%  LMP 02/22/2013  Physical Exam  Nursing note and vitals reviewed. Constitutional: She appears well-developed and well-nourished. No distress.  HENT:  Head: Normocephalic and atraumatic.  Eyes: Conjunctivae are normal. Right eye exhibits no discharge. Left eye exhibits no discharge.  Neck: Neck supple.  Cardiovascular: Normal rate, regular rhythm and normal heart sounds.  Exam reveals no gallop and no friction rub.   No murmur heard. Pulmonary/Chest: Effort normal and breath sounds normal. No respiratory distress.  Abdominal: Soft. She exhibits no distension. There is no tenderness.  Genitourinary:  Pt with a great deal of pain even with gently separating buttocks. What appears to be small anal fissure posterior midline. Great deal of pain with DRE. No stool in vault. Scant pinkish material on glove.   Musculoskeletal: She exhibits no edema and no tenderness.  Neurological: She is alert.  Skin: Skin is warm and dry.  Psychiatric: She has a normal mood and affect. Her behavior is normal. Thought content normal.    ED Course  Procedures (including  critical care time)  Labs Reviewed - No data to display No results found.   1. Anal fissure       MDM  31yf with rectal pain and small amount of bleeding. Both most likely from rectal fissure. Benign abdominal exam. No use of blood thinners. HD stable. Plan symptomatic tx at this time. Return precautions discussed.         Raeford Razor, MD 03/08/13 [phone]

## 2013-12-31 IMAGING — US US OB TRANSVAGINAL
1 series · 14 of 28 positions shown · non-contrast
Comparison: None.

CLINICAL DATA: Abdominal pain

OBSTETRIC <14 WK US AND TRANSVAGINAL OB US
TECHNIQUE: Both transabdominal and transvaginal ultrasound
examinations were performed for complete evaluation of the
gestation as well as the maternal uterus, adnexal regions, and
pelvic cul-de-sac.  Transvaginal technique was performed to assess
early pregnancy.

[Series 1: us ob comp less 14 wks · 33 acquisitions, 14 frames shown]
[im 2/33]
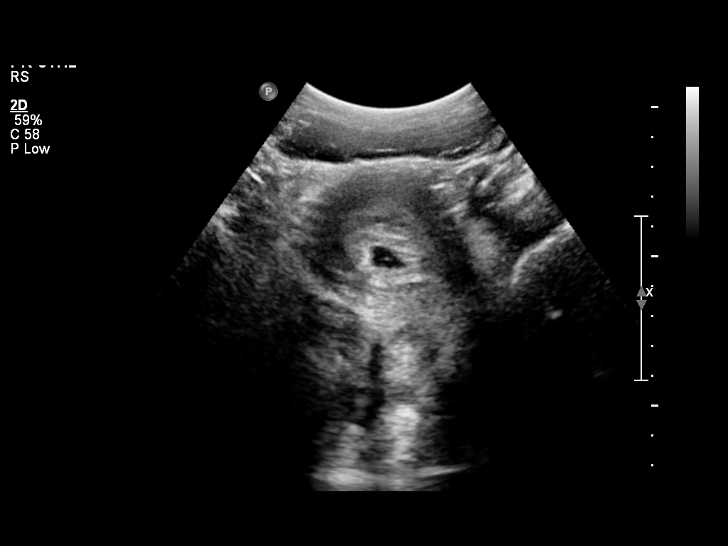
[im 4/33]
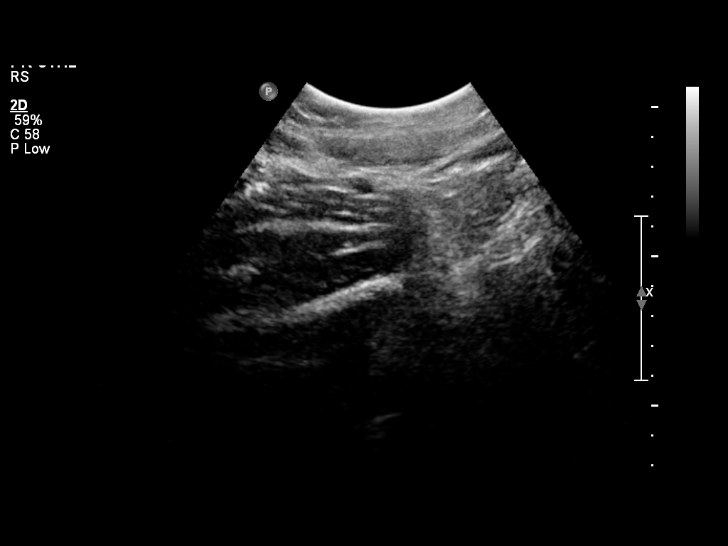
[im 6/33]
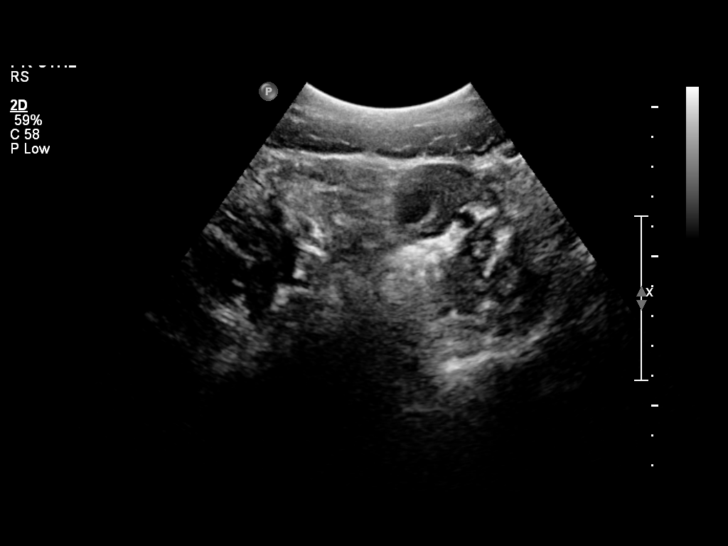
[im 9/33]
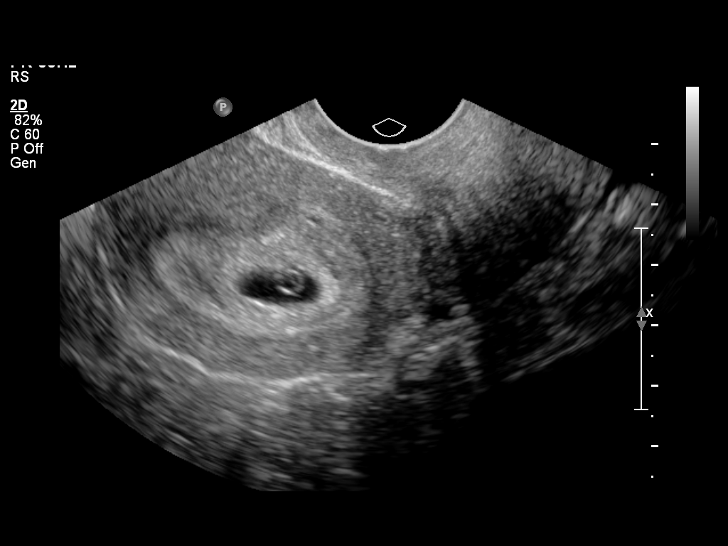
[im 11/33]
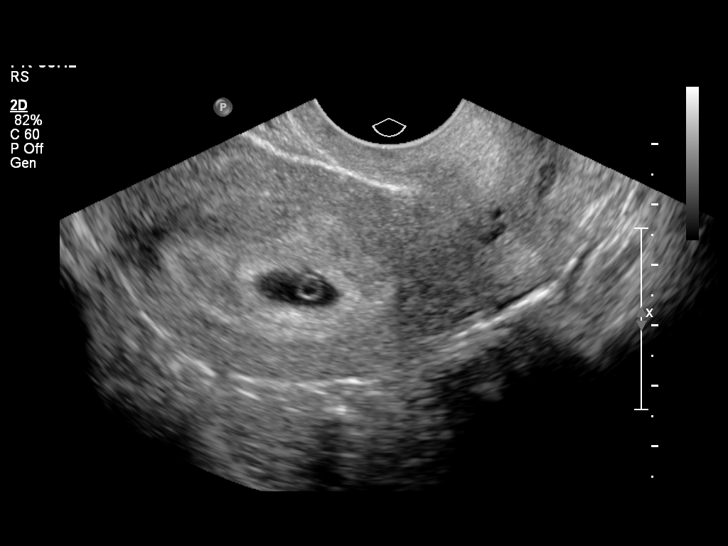
[im 14/33]
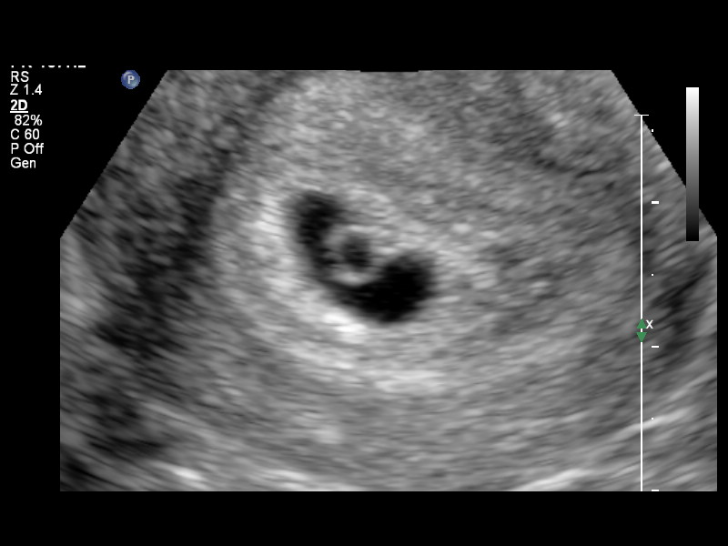
[im 16/33]
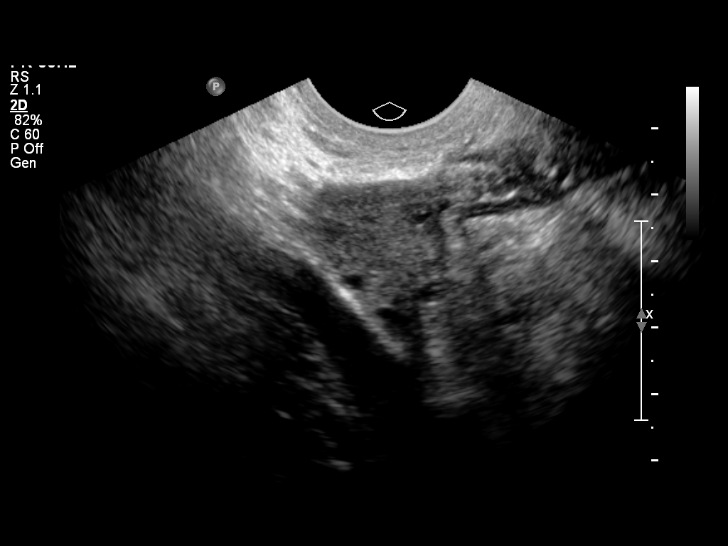
[im 18/33]
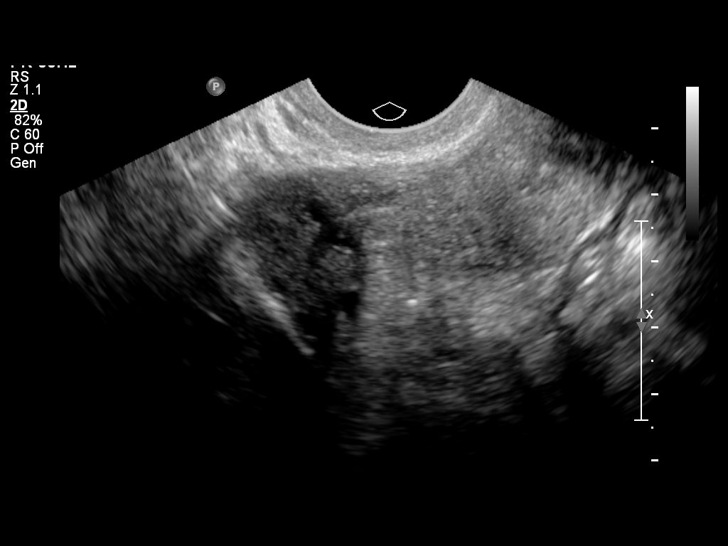
[im 21/33]
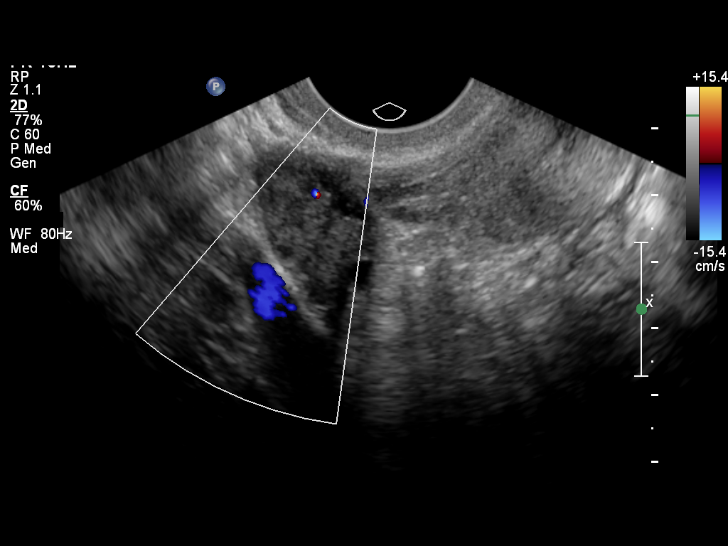
[im 23/33]
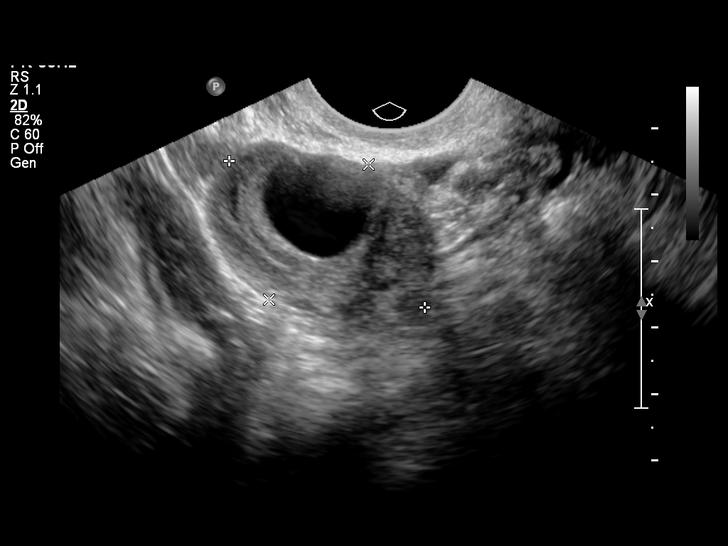
[im 25/33]
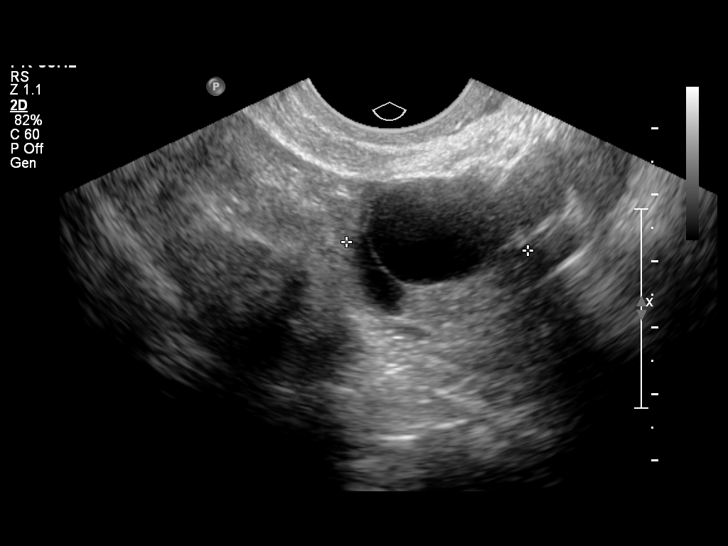
[im 28/33]
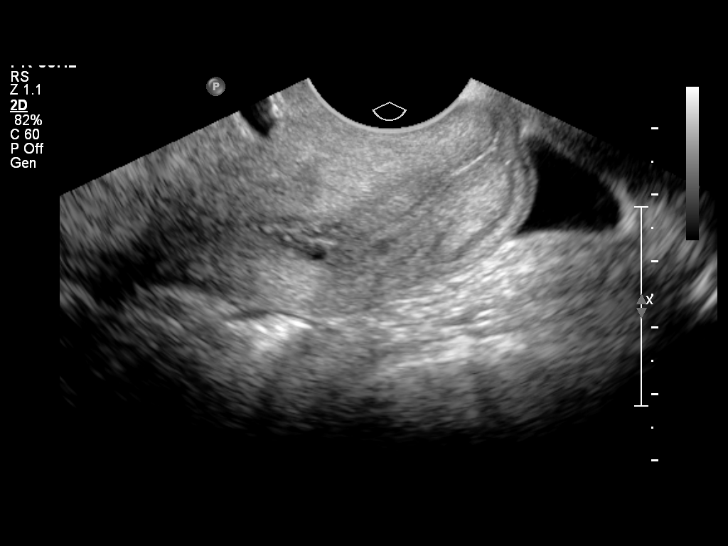
[im 30/33]
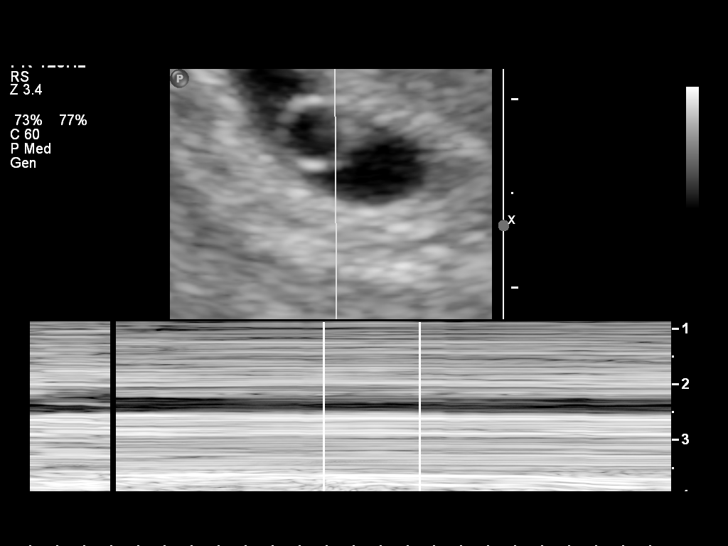
[im 33/33]
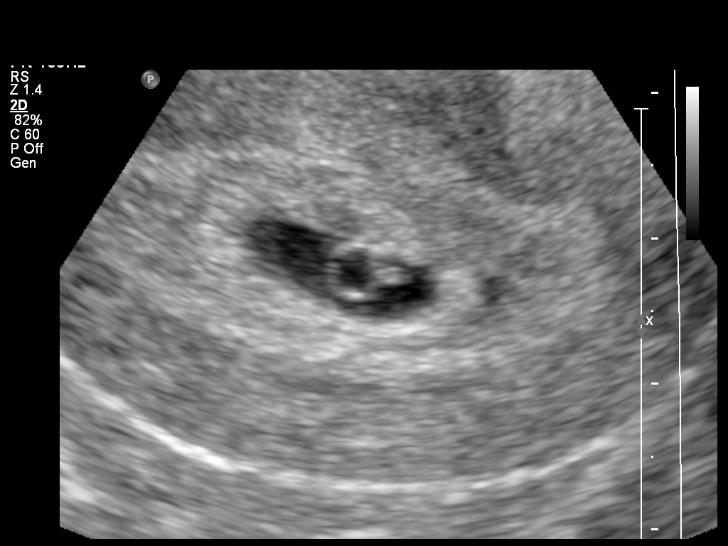

[14 of 28 positions shown; findings below may reference images not displayed]

Intrauterine gestational sac:  Present and single
Yolk sac: Present
Embryo: Present
Cardiac Activity: Identified
Heart Rate: Not measurable

MSD: 9  mm  5         w 4     d

Maternal uterus/adnexae:
Normal ovaries.  No subchorionic hemorrhage.  Trace free fluid.
IMPRESSION: Single uterine gestation with embryo and perceivable but not
measurable cardiac activity.

Estimated gestational age by mean sac diameter equals 5 weeks 4
days.

## 2014-09-18 ENCOUNTER — Encounter (HOSPITAL_COMMUNITY): Payer: Self-pay

## 2015-07-19 ENCOUNTER — Inpatient Hospital Stay (HOSPITAL_COMMUNITY)
Admission: AD | Admit: 2015-07-19 | Discharge: 2015-07-19 | Disposition: A | Discharge status: Home / Self Care | Payer: Self-pay | Source: Ambulatory Visit | Attending: Obstetrics & Gynecology | Admitting: Obstetrics & Gynecology

## 2015-07-19 ENCOUNTER — Encounter (HOSPITAL_COMMUNITY): Payer: Self-pay | Admitting: *Deleted

## 2015-07-19 DIAGNOSIS — L723 Sebaceous cyst: Secondary | ICD-10-CM | POA: Insufficient documentation

## 2015-07-19 DIAGNOSIS — F17210 Nicotine dependence, cigarettes, uncomplicated: Secondary | ICD-10-CM | POA: Insufficient documentation

## 2015-07-19 DIAGNOSIS — N907 Vulvar cyst: Secondary | ICD-10-CM

## 2015-07-19 DIAGNOSIS — N898 Other specified noninflammatory disorders of vagina: Secondary | ICD-10-CM | POA: Insufficient documentation

## 2015-07-19 LAB — RPR: RPR Ser Ql: NONREACTIVE

## 2015-07-19 LAB — POCT PREGNANCY, URINE: PREG TEST UR: NEGATIVE

## 2015-07-19 LAB — URINALYSIS, ROUTINE W REFLEX MICROSCOPIC
Bilirubin Urine: NEGATIVE
GLUCOSE, UA: NEGATIVE mg/dL
HGB URINE DIPSTICK: NEGATIVE
KETONES UR: NEGATIVE mg/dL
Leukocytes, UA: NEGATIVE
Nitrite: NEGATIVE
PROTEIN: NEGATIVE mg/dL
Specific Gravity, Urine: 1.025 (ref 1.005–1.030)
Urobilinogen, UA: 1 mg/dL (ref 0.0–1.0)
pH: 6 (ref 5.0–8.0)

## 2015-07-19 LAB — WET PREP, GENITAL
Trich, Wet Prep: NONE SEEN
WBC WET PREP: NONE SEEN
Yeast Wet Prep HPF POC: NONE SEEN

## 2015-07-19 LAB — GC/CHLAMYDIA PROBE AMP (~~LOC~~) NOT AT ARMC
Chlamydia: NEGATIVE
NEISSERIA GONORRHEA: NEGATIVE

## 2015-07-19 LAB — HIV ANTIBODY (ROUTINE TESTING W REFLEX): HIV SCREEN 4TH GENERATION: NONREACTIVE

## 2015-07-19 NOTE — Discharge Instructions (Signed)
Epidermal Cyst An epidermal cyst is sometimes called a sebaceous cyst, epidermal inclusion cyst, or infundibular cyst. These cysts usually contain a substance that looks "pasty" or "cheesy" and may have a bad smell. This substance is a protein called keratin. Epidermal cysts are usually found on the face, neck, or trunk. They may also occur in the vaginal area or other parts of the genitalia of both men and women. Epidermal cysts are usually small, painless, slow-growing bumps or lumps that move freely under the skin. It is important not to try to pop them. This may cause an infection and lead to tenderness and swelling. CAUSES  Epidermal cysts may be caused by a deep penetrating injury to the skin or a plugged hair follicle, often associated with acne. SYMPTOMS  Epidermal cysts can become inflamed and cause:  Redness.  Tenderness.  Increased temperature of the skin over the bumps or lumps.  Grayish-white, bad smelling material that drains from the bump or lump. DIAGNOSIS  Epidermal cysts are easily diagnosed by your caregiver during an exam. Rarely, a tissue sample (biopsy) may be taken to rule out other conditions that may resemble epidermal cysts. TREATMENT   Epidermal cysts often get better and disappear on their own. They are rarely ever cancerous.  If a cyst becomes infected, it may become inflamed and tender. This may require opening and draining the cyst. Treatment with antibiotics may be necessary. When the infection is gone, the cyst may be removed with minor surgery.  Small, inflamed cysts can often be treated with antibiotics or by injecting steroid medicines.  Sometimes, epidermal cysts become large and bothersome. If this happens, surgical removal in your caregiver's office may be necessary. HOME CARE INSTRUCTIONS  Only take over-the-counter or prescription medicines as directed by your caregiver.  Take your antibiotics as directed. Finish them even if you start to feel  better. SEEK MEDICAL CARE IF:   Your cyst becomes tender, red, or swollen.  Your condition is not improving or is getting worse.  You have any other questions or concerns. MAKE SURE YOU:  Understand these instructions.  Will watch your condition.  Will get help right away if you are not doing well or get worse. Document Released: 10/04/2004 Document Revised: 01/26/2012 Document Reviewed: 05/12/2011 Exit[employer] Patient Information 2015 [employer]. This information is not intended to replace advice given to you by your health care provider. Make sure you discuss any questions you have with your health care provider.  

## 2015-07-19 NOTE — MAU Note (Addendum)
PT SAYS  HER VAGINA  HURT AND SWOLLEN   AFTER HAVING SEX IN AUG  .    WHEN CYCLE STARTED  - PAIN  STOPPED.  Marland Kitchen   THEN   CYCLE OVER  THE PAIN CAME  BACK   AND  SOME SWELLING.     THEN YESTERDAY  WHILE  HAD ON TIGHT  JEANS  - AND  SHE FELT  A  BUMP ON PERINEUM.   AND  ON  INSIDE. NO   BIRTH CONTROL.   LAST  SEX-  8-5

## 2015-07-19 NOTE — MAU Provider Note (Signed)
History     CSN: [id number]  Arrival date and time: 07/19/15 4098   First Provider Initiated Contact with Patient 07/19/15 [phone]      No chief complaint on file.  Vaginal Discharge The patient's primary symptoms include genital lesions and vaginal discharge. This is a new problem. The current episode started yesterday. The problem occurs constantly. The problem has been unchanged. The patient is experiencing no pain ("irritated feeling"). The problem affects the right side. She is not pregnant. Pertinent negatives include no abdominal pain, constipation, diarrhea, dysuria, fever, frequency, nausea, urgency or vomiting. The vaginal discharge was watery. There has been no bleeding. Exacerbated by: wearing tight clothes  She has tried nothing for the symptoms. She is not sexually active (not currently ). It is possible that her partner has an STD. She uses condoms for contraception. Her menstrual history has been irregular.    Past Medical History  Diagnosis Date  . Chlamydia   . Gonorrhea   . Trichomonas     Past Surgical History  Procedure Laterality Date  . Cesarean section  07/15/2012    Procedure: CESAREAN SECTION;  Surgeon: Kathreen Cosier, MD;  Location: [facility];  Service: Gynecology;  Laterality: N/A;    Family History  Problem Relation Age of Onset  . Anesthesia problems Neg Hx   . Diabetes Mother   . Heart disease Father     Social History  Substance Use Topics  . Smoking status: Current Every Day Smoker -- 0.25 packs/day for 10 years  . Smokeless tobacco: Never Used  . Alcohol Use: No     Comment: occ    Allergies:  Allergies  Allergen Reactions  . Tylenol [Acetaminophen] Nausea And Vomiting    Prescriptions prior to admission  Medication Sig Dispense Refill Last Dose  . [DISCONTINUED] lidocaine (XYLOCAINE JELLY) 2 % jelly Apply topically as needed. (Patient not taking: Reported on 07/19/2015) 30 mL 0 Not Taking at Unknown time  . [DISCONTINUED] naproxen  (NAPROSYN) 375 MG tablet Take 1 tablet (375 mg total) by mouth 2 (two) times daily. (Patient not taking: Reported on 07/19/2015) 20 tablet 0 Not Taking at Unknown time  . [DISCONTINUED] psyllium (METAMUCIL SMOOTH TEXTURE) 28 % packet Take 1 packet by mouth 2 (two) times daily. (Patient not taking: Reported on 07/19/2015) 30 packet 0 Not Taking at Unknown time    Review of Systems  Constitutional: Negative for fever.  Gastrointestinal: Negative for nausea, vomiting, abdominal pain, diarrhea and constipation.  Genitourinary: Positive for vaginal discharge. Negative for dysuria, urgency and frequency.   Physical Exam   Blood pressure 106/76, pulse 72, temperature 98.5 F (36.9 C), temperature source Oral, resp. rate 20, height  (1.473 m), weight 78.983 kg (174 lb 2 oz), last menstrual period 06/29/2015.  Physical Exam  Nursing note and vitals reviewed. Constitutional: She is oriented to person, place, and time. She appears well-developed and well-nourished. No distress.  HENT:  Head: Normocephalic.  Cardiovascular: Normal rate.   Respiratory: Effort normal.  GI: Soft. There is no tenderness. There is no rebound.  Genitourinary:   External: Small 0.5x0.5 sebaceous cyst on the left labia majora Vagina: small amount of white discharge    Neurological: She is alert and oriented to person, place, and time.  Skin: Skin is warm and dry.  Psychiatric: She has a normal mood and affect.   Results for orders placed or performed during the hospital encounter of 07/19/15 (from the past 24 hour(s))  Urinalysis, Routine w reflex microscopic (  not at [facility])     Status: None   Collection Time: 07/19/15  6:45 AM  Result Value Ref Range   Color, Urine YELLOW YELLOW   APPearance CLEAR CLEAR   Specific Gravity, Urine 1.025 1.005 - 1.030   pH 6.0 5.0 - 8.0   Glucose, UA NEGATIVE NEGATIVE mg/dL   Hgb urine dipstick NEGATIVE NEGATIVE   Bilirubin Urine NEGATIVE NEGATIVE   Ketones, ur NEGATIVE NEGATIVE  mg/dL   Protein, ur NEGATIVE NEGATIVE mg/dL   Urobilinogen, UA 1.0 0.0 - 1.0 mg/dL   Nitrite NEGATIVE NEGATIVE   Leukocytes, UA NEGATIVE NEGATIVE  Pregnancy, urine POC     Status: None   Collection Time: 07/19/15  6:56 AM  Result Value Ref Range   Preg Test, Ur NEGATIVE NEGATIVE  Wet prep, genital     Status: Abnormal   Collection Time: 07/19/15  7:05 AM  Result Value Ref Range   Yeast Wet Prep HPF POC NONE SEEN NONE SEEN   Trich, Wet Prep NONE SEEN NONE SEEN   Clue Cells Wet Prep HPF POC FEW (A) NONE SEEN   WBC, Wet Prep HPF POC NONE SEEN NONE SEEN    MAU Course  Procedures  MDM   Assessment and Plan   1. Sebaceous cyst of labia    DC home Comfort measures reviewed  RX: none  Return to MAU as needed FU with OB as planned  Follow-up Information    Follow up with [facility] [place].   Why:  As needed   Contact information:   [address] [facility] [address] [phone]        Tawnya Crook 07/19/2015, 7:33 AM

## 2016-03-07 ENCOUNTER — Encounter (HOSPITAL_COMMUNITY): Payer: Self-pay | Admitting: Emergency Medicine

## 2016-03-07 ENCOUNTER — Emergency Department (HOSPITAL_COMMUNITY)
Admission: EM | Admit: 2016-03-07 | Discharge: 2016-03-07 | Disposition: A | Discharge status: Home / Self Care | Payer: No Typology Code available for payment source | Attending: Emergency Medicine | Admitting: Emergency Medicine

## 2016-03-07 ENCOUNTER — Emergency Department (HOSPITAL_COMMUNITY): Payer: No Typology Code available for payment source

## 2016-03-07 DIAGNOSIS — Y998 Other external cause status: Secondary | ICD-10-CM | POA: Insufficient documentation

## 2016-03-07 DIAGNOSIS — M25562 Pain in left knee: Secondary | ICD-10-CM

## 2016-03-07 DIAGNOSIS — F17200 Nicotine dependence, unspecified, uncomplicated: Secondary | ICD-10-CM | POA: Insufficient documentation

## 2016-03-07 DIAGNOSIS — Z8619 Personal history of other infectious and parasitic diseases: Secondary | ICD-10-CM | POA: Diagnosis not present

## 2016-03-07 DIAGNOSIS — S6992XA Unspecified injury of left wrist, hand and finger(s), initial encounter: Secondary | ICD-10-CM | POA: Diagnosis present

## 2016-03-07 DIAGNOSIS — Y92410 Unspecified street and highway as the place of occurrence of the external cause: Secondary | ICD-10-CM | POA: Insufficient documentation

## 2016-03-07 DIAGNOSIS — S63502A Unspecified sprain of left wrist, initial encounter: Secondary | ICD-10-CM

## 2016-03-07 DIAGNOSIS — S8992XA Unspecified injury of left lower leg, initial encounter: Secondary | ICD-10-CM | POA: Diagnosis not present

## 2016-03-07 DIAGNOSIS — S0990XA Unspecified injury of head, initial encounter: Secondary | ICD-10-CM | POA: Insufficient documentation

## 2016-03-07 DIAGNOSIS — Y9389 Activity, other specified: Secondary | ICD-10-CM | POA: Insufficient documentation

## 2016-03-07 MED ORDER — TRAMADOL HCL 50 MG PO TABS: 50.0000 mg | ORAL_TABLET | Freq: Four times a day (QID) | ORAL | Status: DC | PRN

## 2016-03-07 NOTE — Discharge Instructions (Signed)
Do not take the narcotic while driving as it will make you sleepy.
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
## 2016-03-07 NOTE — ED Notes (Signed)
Pt ambulatory and independent at discharge.  

## 2016-03-13 ENCOUNTER — Encounter (HOSPITAL_COMMUNITY): Payer: Self-pay | Admitting: Emergency Medicine

## 2016-03-13 ENCOUNTER — Emergency Department (HOSPITAL_COMMUNITY)
Admission: EM | Admit: 2016-03-13 | Discharge: 2016-03-13 | Disposition: A | Discharge status: Home / Self Care | Payer: Medicaid Other | Attending: Emergency Medicine | Admitting: Emergency Medicine

## 2016-03-13 DIAGNOSIS — G43009 Migraine without aura, not intractable, without status migrainosus: Secondary | ICD-10-CM | POA: Insufficient documentation

## 2016-03-13 DIAGNOSIS — Z8619 Personal history of other infectious and parasitic diseases: Secondary | ICD-10-CM | POA: Insufficient documentation

## 2016-03-13 DIAGNOSIS — M545 Low back pain: Secondary | ICD-10-CM | POA: Insufficient documentation

## 2016-03-13 DIAGNOSIS — F17200 Nicotine dependence, unspecified, uncomplicated: Secondary | ICD-10-CM | POA: Insufficient documentation

## 2016-03-13 MED ORDER — KETOROLAC TROMETHAMINE 60 MG/2ML IM SOLN
60.0000 mg | Freq: Once | INTRAMUSCULAR | Status: AC
  Administered 2016-03-13: 60 mg via INTRAMUSCULAR
  Filled 2016-03-13: qty 2

## 2016-03-13 MED ORDER — DICLOFENAC SODIUM 50 MG PO TBEC: 50.0000 mg | DELAYED_RELEASE_TABLET | Freq: Two times a day (BID) | ORAL | Status: DC

## 2016-03-13 MED ORDER — METHOCARBAMOL 500 MG PO TABS: 500.0000 mg | ORAL_TABLET | Freq: Two times a day (BID) | ORAL | Status: DC
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
## 2016-03-13 NOTE — Discharge Instructions (Signed)
Migraine Headache  A migraine headache is very bad, throbbing pain on one or both sides of your head. Talk to your doctor about what things may bring on (trigger) your migraine headaches.  HOME CARE  · Only take medicines as told by your doctor.  · Lie down in a dark, quiet room when you have a migraine.  · Keep a journal to find out if certain things bring on migraine headaches. For example, write down:    What you eat and drink.    How much sleep you get.    Any change to your diet or medicines.  · Lessen how much alcohol you drink.  · Quit smoking if you smoke.  · Get enough sleep.  · Lessen any stress in your life.  · Keep lights dim if bright lights bother you or make your migraines worse.  GET HELP RIGHT AWAY IF:   · Your migraine becomes really bad.  · You have a fever.  · You have a stiff neck.  · You have trouble seeing.  · Your muscles are weak, or you lose muscle control.  · You lose your balance or have trouble walking.  · You feel like you will pass out (faint), or you pass out.  · You have really bad symptoms that are different than your first symptoms.  MAKE SURE YOU:   · Understand these instructions.  · Will watch your condition.  · Will get help right away if you are not doing well or get worse.     This information is not intended to replace advice given to you by your health care provider. Make sure you discuss any questions you have with your health care provider.     Document Released: 08/12/2008 Document Revised: 01/26/2012 Document Reviewed: 07/11/2013  Elsevier Interactive Patient Education ©2016 Elsevier Inc.

## 2016-06-02 ENCOUNTER — Inpatient Hospital Stay (HOSPITAL_COMMUNITY)
Admission: AD | Admit: 2016-06-02 | Discharge: 2016-06-02 | Disposition: A | Discharge status: Home / Self Care | Payer: No Typology Code available for payment source | Source: Ambulatory Visit | Attending: Obstetrics and Gynecology | Admitting: Obstetrics and Gynecology

## 2016-06-02 ENCOUNTER — Encounter (HOSPITAL_COMMUNITY): Payer: Self-pay | Admitting: *Deleted

## 2016-06-02 DIAGNOSIS — F17210 Nicotine dependence, cigarettes, uncomplicated: Secondary | ICD-10-CM | POA: Insufficient documentation

## 2016-06-02 DIAGNOSIS — L298 Other pruritus: Secondary | ICD-10-CM

## 2016-06-02 DIAGNOSIS — N760 Acute vaginitis: Secondary | ICD-10-CM | POA: Insufficient documentation

## 2016-06-02 DIAGNOSIS — N898 Other specified noninflammatory disorders of vagina: Secondary | ICD-10-CM

## 2016-06-02 LAB — WET PREP, GENITAL
CLUE CELLS WET PREP: NONE SEEN
Sperm: NONE SEEN
TRICH WET PREP: NONE SEEN
YEAST WET PREP: NONE SEEN

## 2016-06-02 MED ORDER — HYDROCORTISONE 1 % EX OINT: 1.0000 "application " | TOPICAL_OINTMENT | Freq: Two times a day (BID) | CUTANEOUS | Status: DC

## 2016-06-02 NOTE — Discharge Instructions (Signed)
Vaginitis Vaginitis is an inflammation of the vagina. It is most often caused by a change in the normal balance of the bacteria and yeast that live in the vagina. This change in balance causes an overgrowth of certain bacteria or yeast, which causes the inflammation. There are different types of vaginitis, but the most common types are:  Bacterial vaginosis.  Yeast infection (candidiasis).  Trichomoniasis vaginitis. This is a sexually transmitted infection (STI).  Viral vaginitis.  Atrophic vaginitis.  Allergic vaginitis. CAUSES  The cause depends on the type of vaginitis. Vaginitis can be caused by:  Bacteria (bacterial vaginosis).  Yeast (yeast infection).  A parasite (trichomoniasis vaginitis)  A virus (viral vaginitis).  Low hormone levels (atrophic vaginitis). Low hormone levels can occur during pregnancy, breastfeeding, or after menopause.  Irritants, such as bubble baths, scented tampons, and feminine sprays (allergic vaginitis). Other factors can change the normal balance of the yeast and bacteria that live in the vagina. These include:  Antibiotic medicines.  Poor hygiene.  Diaphragms, vaginal sponges, spermicides, birth control pills, and intrauterine devices (IUD).  Sexual intercourse.  Infection.  Uncontrolled diabetes.  A weakened immune system. SYMPTOMS  Symptoms can vary depending on the cause of the vaginitis. Common symptoms include:  Abnormal vaginal discharge.  The discharge is white, gray, or yellow with bacterial vaginosis.  The discharge is thick, white, and cheesy with a yeast infection.  The discharge is frothy and yellow or greenish with trichomoniasis.  A bad vaginal odor.  The odor is fishy with bacterial vaginosis.  Vaginal itching, pain, or swelling.  Painful intercourse.  Pain or burning when urinating. Sometimes, there are no symptoms. TREATMENT  Treatment will vary depending on the type of infection.   Bacterial  vaginosis and trichomoniasis are often treated with antibiotic creams or pills.  Yeast infections are often treated with antifungal medicines, such as vaginal creams or suppositories.  Viral vaginitis has no cure, but symptoms can be treated with medicines that relieve discomfort. Your sexual partner should be treated as well.  Atrophic vaginitis may be treated with an estrogen cream, pill, suppository, or vaginal ring. If vaginal dryness occurs, lubricants and moisturizing creams may help. You may be told to avoid scented soaps, sprays, or douches.  Allergic vaginitis treatment involves quitting the use of the product that is causing the problem. Vaginal creams can be used to treat the symptoms. HOME CARE INSTRUCTIONS   Take all medicines as directed by your caregiver.  Keep your genital area clean and dry. Avoid soap and only rinse the area with water.  Avoid douching. It can remove the healthy bacteria in the vagina.  Do not use tampons or have sexual intercourse until your vaginitis has been treated. Use sanitary pads while you have vaginitis.  Wipe from front to back. This avoids the spread of bacteria from the rectum to the vagina.  Let air reach your genital area.  Wear cotton underwear to decrease moisture buildup.  Avoid wearing underwear while you sleep until your vaginitis is gone.  Avoid tight pants and underwear or nylons without a cotton panel.  Take off wet clothing (especially bathing suits) as soon as possible.  Use mild, non-scented products. Avoid using irritants, such as:  Scented feminine sprays.  Fabric softeners.  Scented detergents.  Scented tampons.  Scented soaps or bubble baths.  Practice safe sex and use condoms. Condoms may prevent the spread of trichomoniasis and viral vaginitis. SEEK MEDICAL CARE IF:   You have abdominal pain.  You  have a fever or persistent symptoms for more than 2-3 days.  You have a fever and your symptoms suddenly  get worse.   This information is not intended to replace advice given to you by your health care provider. Make sure you discuss any questions you have with your health care provider.   Document Released: 08/31/2007 Document Revised: 03/20/2015 Document Reviewed: 04/15/2012 Elsevier Interactive Patient Education 2016 [employer]  Safe Sex Safe sex is about reducing the risk of giving or getting a sexually transmitted disease (STD). STDs are spread through sexual contact involving the genitals, mouth, or rectum. Some STDs can be cured and others cannot. Safe sex can also prevent unintended pregnancies.  WHAT ARE SOME SAFE SEX PRACTICES?  Limit your sexual activity to only one partner who is having sex with only you.  Talk to your partner about his or her past partners, past STDs, and drug use.  Use a condom every time you have sexual intercourse. This includes vaginal, oral, and anal sexual activity. Both females and males should wear condoms during oral sex. Only use latex or polyurethane condoms and water-based lubricants. Using petroleum-based lubricants or oils to lubricate a condom will weaken the condom and increase the chance that it will break. The condom should be in place from the beginning to the end of sexual activity. Wearing a condom reduces, but does not completely eliminate, your risk of getting or giving an STD. STDs can be spread by contact with infected body fluids and skin.  Get vaccinated for hepatitis B and HPV.  Avoid alcohol and recreational drugs, which can affect your judgment. You may forget to use a condom or participate in high-risk sex.  For females, avoid douching after sexual intercourse. Douching can spread an infection farther into the reproductive tract.  Check your body for signs of sores, blisters, rashes, or unusual discharge. See your health care provider if you notice any of these signs.  Avoid sexual contact if you have symptoms of an infection or  are being treated for an STD. If you or your partner has herpes, avoid sexual contact when blisters are present. Use condoms at all other times.  If you are at risk of being infected with HIV, it is recommended that you take a prescription medicine daily to prevent HIV infection. This is called pre-exposure prophylaxis (PrEP). You are considered at risk if:  You are a man who has sex with other men (MSM).  You are a heterosexual man or woman who is sexually active with more than one partner.  You take drugs by injection.  You are sexually active with a partner who has HIV.  Talk with your health care provider about whether you are at high risk of being infected with HIV. If you choose to begin PrEP, you should first be tested for HIV. You should then be tested every 3 months for as long as you are taking PrEP.  See your health care provider for regular screenings, exams, and tests for other STDs. Before having sex with a new partner, each of you should be screened for STDs and should talk about the results with each other. WHAT ARE THE BENEFITS OF SAFE SEX?   There is less chance of getting or giving an STD.  You can prevent unwanted or unintended pregnancies.  By discussing safe sex concerns with your partner, you may increase feelings of intimacy, comfort, trust, and honesty between the two of you.   This information is not intended  replace advice given to you by your health care provider. Make sure you discuss any questions you have with your health care provider. °  °Document Released: 12/11/2004 Document Revised: 11/24/2014 Document Reviewed: 04/26/2012 °Elsevier Interactive Patient Education ©2016 Elsevier Inc. ° °

## 2016-06-02 NOTE — MAU Provider Note (Signed)
Chief Complaint:  Vaginal Itching   First Provider Initiated Contact with Patient 06/02/16 1321      [name] [name] is a 35 y.o. G1P1001 who presents to maternity admissions reporting itching of vagina and perineal area. Thinks it may be due to pads and underwear.  Vaginal itching comes and goes.. Does not really have much discharge. She reports vaginal bleeding, vaginal itching/burning, urinary symptoms, h/a, dizziness, n/v, or fever/chills.    Vaginal Itching The patient's primary symptoms include genital itching and vaginal discharge. The patient's pertinent negatives include no pelvic pain or vaginal bleeding. This is a recurrent problem. The current episode started 1 to 4 weeks ago. The problem occurs intermittently. The problem has been unchanged. The patient is experiencing no pain. Pertinent negatives include no abdominal pain, back pain, chills, constipation, diarrhea, dysuria, fever, hematuria, nausea or vomiting. The vaginal discharge was white. There has been no bleeding. She has not been passing clots. She has not been passing tissue. Nothing aggravates the symptoms. She has tried nothing for the symptoms. It is unknown whether or not her partner has an STD.     RN Note: Not sure if it is a rash or what. pantiline area is very irritated, during cycle was having vaginal itching. Didn't know if it was from pad or what. Not seeing dc like a yeast inf, but wanted to get checked out          Past Medical History: Past Medical History  Diagnosis Date  . Chlamydia   . Gonorrhea   . Trichomonas     Past obstetric history: OB History  Gravida Para Term Preterm AB SAB TAB Ectopic Multiple Living  1 1 1  0 0 0 0 0 0 1    # Outcome Date GA Lbr Len/2nd Weight Sex Delivery Anes PTL Lv  1 Term 07/15/12 [id number]  3.2 kg (7 lb 0.9 oz) M CS-LTranv EPI  Y      Past Surgical History: Past Surgical History  Procedure Laterality Date  . Cesarean section  07/15/2012    Procedure:  CESAREAN SECTION;  Surgeon: Kathreen CosierBernard A Marshall, MD;  Location: [facility];  Service: Gynecology;  Laterality: N/A;    Family History: Family History  Problem Relation Age of Onset  . Anesthesia problems Neg Hx   . Diabetes Mother   . Heart disease Father     Social History: Social History  Substance Use Topics  . Smoking status: Current Every Day Smoker -- 0.25 packs/day for 10 years  . Smokeless tobacco: Never Used  . Alcohol Use: No     Comment: occ    Allergies:  Allergies  Allergen Reactions  . Tylenol [Acetaminophen] Nausea And Vomiting    Meds:  Prescriptions prior to admission  Medication Sig Dispense Refill Last Dose  . diclofenac (VOLTAREN) 50 MG EC tablet Take 1 tablet (50 mg total) by mouth 2 (two) times daily. 20 tablet 0   . methocarbamol (ROBAXIN) 500 MG tablet Take 1 tablet (500 mg total) by mouth 2 (two) times daily. 20 tablet 0   . traMADol (ULTRAM) 50 MG tablet Take 1 tablet (50 mg total) by mouth every 6 (six) hours as needed. 15 tablet 0     I have reviewed patient's Past Medical Hx, Surgical Hx, Family Hx, Social Hx, medications and allergies.  ROS:  Review of Systems  Constitutional: Negative for fever and chills.  Gastrointestinal: Negative for nausea, vomiting, abdominal pain, diarrhea and constipation.  Genitourinary: Positive for vaginal discharge. Negative  for dysuria, hematuria and pelvic pain.  Musculoskeletal: Negative for back pain.   Other systems negative     Physical Exam  Patient Vitals for the past 24 hrs:  BP Temp Temp src Pulse Resp Weight  06/02/16 1304 110/64 mmHg 98.5 F (36.9 C) Oral 76 18 82.555 kg (182 lb)   Constitutional: Well-developed, well-nourished female in no acute distress.  Cardiovascular: normal rate and rhythm, no ectopy  Respiratory: normal effort, no distress. GI: Abd soft, non-tender.  Nondistended.  No rebound, No guarding.  Bowel Sounds audible  MS: Extremities nontender, no edema, normal  ROM Neurologic: Alert and oriented x 4.   Grossly nonfocal. GU: Neg CVAT. Skin:  Warm and Dry Psych:  Affect appropriate.  PELVIC EXAM: Cervix pink, visually closed, without lesion, scant white creamy discharge, vaginal walls and external genitalia normal    Some loss of pigment in cruciate folds, but no erethema, lesions or signs of irritation Bimanual exam: Cervix firm, anterior, neg CMT, uterus nontender, nonenlarged, adnexa without tenderness, enlargement, or mass    Labs: Results for orders placed or performed during the hospital encounter of 06/02/16 (from the past 24 hour(s))  Wet prep, genital     Status: Abnormal   Collection Time: 06/02/16  1:45 PM  Result Value Ref Range   Yeast Wet Prep HPF POC NONE SEEN NONE SEEN   Trich, Wet Prep NONE SEEN NONE SEEN   Clue Cells Wet Prep HPF POC NONE SEEN NONE SEEN   WBC, Wet Prep HPF POC FEW (A) NONE SEEN   Sperm NONE SEEN     Imaging:  No results found.  MAU Course/MDM: I have ordered labs as follows: wet prep, GC/Chlamydia, HIV Imaging ordered: none Results reviewed.   Pt stable at time of discharge.  Assessment: Vaginitis, nonspecific  Plan: Discharge home Recommend perineal care Rx sent for Hydrocortisone ointment for itching. Warned to limit use to less than two weeks  Will followup as needed     Medication List    ASK your doctor about these medications        diclofenac 50 MG EC tablet  Commonly known as:  VOLTAREN  Take 1 tablet (50 mg total) by mouth 2 (two) times daily.     methocarbamol 500 MG tablet  Commonly known as:  ROBAXIN  Take 1 tablet (500 mg total) by mouth 2 (two) times daily.     traMADol 50 MG tablet  Commonly known as:  ULTRAM  Take 1 tablet (50 mg total) by mouth every 6 (six) hours as needed.       Encouraged to return here or to other Urgent Care/ED if she develops worsening of symptoms, increase in pain, fever, or other concerning symptoms.   Wynelle Bourgeois CNM, MSN Certified  Nurse-Midwife 06/02/2016 1:21 PM

## 2016-06-02 NOTE — MAU Note (Signed)
Not sure if it is a rash or what. pantiline area is very irritated, during cycle was having vaginal itching.  Didn't know if it was from pad or what.  Not seeing dc like a yeast inf, but wanted to get checked out

## 2016-06-03 LAB — GC/CHLAMYDIA PROBE AMP (~~LOC~~) NOT AT ARMC
Chlamydia: NEGATIVE
Neisseria Gonorrhea: NEGATIVE

## 2016-06-03 LAB — HIV ANTIBODY (ROUTINE TESTING W REFLEX): HIV Screen 4th Generation wRfx: NONREACTIVE

## 2016-11-17 ENCOUNTER — Encounter (HOSPITAL_COMMUNITY): Payer: Self-pay | Admitting: Emergency Medicine

## 2016-11-17 ENCOUNTER — Emergency Department (HOSPITAL_COMMUNITY): Payer: Self-pay

## 2016-11-17 ENCOUNTER — Emergency Department (HOSPITAL_COMMUNITY)
Admission: EM | Admit: 2016-11-17 | Discharge: 2016-11-17 | Disposition: A | Discharge status: Home / Self Care | Payer: Self-pay | Attending: Emergency Medicine | Admitting: Emergency Medicine

## 2016-11-17 DIAGNOSIS — N760 Acute vaginitis: Secondary | ICD-10-CM | POA: Insufficient documentation

## 2016-11-17 DIAGNOSIS — B9689 Other specified bacterial agents as the cause of diseases classified elsewhere: Secondary | ICD-10-CM | POA: Insufficient documentation

## 2016-11-17 DIAGNOSIS — F17200 Nicotine dependence, unspecified, uncomplicated: Secondary | ICD-10-CM | POA: Insufficient documentation

## 2016-11-17 LAB — CBC
HCT: 37.5 % (ref 36.0–46.0)
Hemoglobin: 12.4 g/dL (ref 12.0–15.0)
MCH: 28.4 pg (ref 26.0–34.0)
MCHC: 33.1 g/dL (ref 30.0–36.0)
MCV: 86 fL (ref 78.0–100.0)
PLATELETS: 174 10*3/uL (ref 150–400)
RBC: 4.36 MIL/uL (ref 3.87–5.11)
RDW: 13.2 % (ref 11.5–15.5)
WBC: 6.2 10*3/uL (ref 4.0–10.5)

## 2016-11-17 LAB — COMPREHENSIVE METABOLIC PANEL
ALK PHOS: 53 U/L (ref 38–126)
ALT: 19 U/L (ref 14–54)
AST: 19 U/L (ref 15–41)
Albumin: 3.6 g/dL (ref 3.5–5.0)
Anion gap: 4 — ABNORMAL LOW (ref 5–15)
BILIRUBIN TOTAL: 0.6 mg/dL (ref 0.3–1.2)
BUN: 7 mg/dL (ref 6–20)
CALCIUM: 9.1 mg/dL (ref 8.9–10.3)
CO2: 25 mmol/L (ref 22–32)
CREATININE: 0.79 mg/dL (ref 0.44–1.00)
Chloride: 108 mmol/L (ref 101–111)
Glucose, Bld: 95 mg/dL (ref 65–99)
Potassium: 3.8 mmol/L (ref 3.5–5.1)
Sodium: 137 mmol/L (ref 135–145)
TOTAL PROTEIN: 7.7 g/dL (ref 6.5–8.1)

## 2016-11-17 LAB — URINALYSIS, ROUTINE W REFLEX MICROSCOPIC
BILIRUBIN URINE: NEGATIVE
Glucose, UA: NEGATIVE mg/dL
Ketones, ur: NEGATIVE mg/dL
Leukocytes, UA: NEGATIVE
NITRITE: NEGATIVE
PROTEIN: NEGATIVE mg/dL
SPECIFIC GRAVITY, URINE: 1.019 (ref 1.005–1.030)
pH: 5 (ref 5.0–8.0)

## 2016-11-17 LAB — I-STAT BETA HCG BLOOD, ED (MC, WL, AP ONLY)

## 2016-11-17 LAB — WET PREP, GENITAL
SPERM: NONE SEEN
TRICH WET PREP: NONE SEEN
YEAST WET PREP: NONE SEEN

## 2016-11-17 MED ORDER — HYDROMORPHONE HCL 2 MG/ML IJ SOLN
1.0000 mg | Freq: Once | INTRAMUSCULAR | Status: AC
  Administered 2016-11-17: 1 mg via INTRAVENOUS
  Filled 2016-11-17: qty 1

## 2016-11-17 MED ORDER — SODIUM CHLORIDE 0.9 % IV BOLUS (SEPSIS)
1000.0000 mL | Freq: Once | INTRAVENOUS | Status: AC
  Administered 2016-11-17: 1000 mL via INTRAVENOUS

## 2016-11-17 MED ORDER — IBUPROFEN 400 MG PO TABS: 400.0000 mg | ORAL_TABLET | Freq: Four times a day (QID) | ORAL | 0 refills | Status: DC | PRN

## 2016-11-17 MED ORDER — ONDANSETRON 4 MG PO TBDP: 4.0000 mg | ORAL_TABLET | Freq: Three times a day (TID) | ORAL | 0 refills | Status: DC | PRN

## 2016-11-17 MED ORDER — ONDANSETRON 4 MG PO TBDP
4.0000 mg | ORAL_TABLET | Freq: Once | ORAL | Status: AC
  Administered 2016-11-17: 4 mg via ORAL
  Filled 2016-11-17: qty 1

## 2016-11-17 MED ORDER — METRONIDAZOLE 500 MG PO TABS: 500.0000 mg | ORAL_TABLET | Freq: Two times a day (BID) | ORAL | 0 refills | Status: AC

## 2016-11-17 NOTE — ED Notes (Signed)
Ambulated to the restroom with no difficulty.

## 2016-11-17 NOTE — ED Notes (Signed)
Patient transported to Ultrasound 

## 2016-11-17 NOTE — ED Notes (Signed)
PA at bedside.

## 2016-11-17 NOTE — Discharge Instructions (Signed)
Please take one tablet of metronidazole 2 times daily for 7 days. Please take ibuprofen every 6 hours as needed for pain. Please take Zofran every 8 hours as needed for nausea and vomiting. Please follow-up with your primary care physician in 2-5 days.  Contact a health care provider if: Your symptoms do not improve, even after treatment. You have more discharge or pain when urinating. You have a fever. You have pain in your abdomen. You have pain during sex. You have vaginal bleeding between periods.

## 2016-11-17 NOTE — ED Triage Notes (Signed)
Per [facility] patient called out sudden onset of abdominal pain at 0730 this morning, rates 10/10 sharp pain.  Patient tearful, states she felt this pain before when she had her son, stating [facility] and a large blood clot came out of her vagina.  Patient is alert and oriented at this time and in no apparent distress.

## 2016-11-17 NOTE — ED Notes (Signed)
Dr Cook at bedside

## 2016-11-17 NOTE — ED Provider Notes (Signed)
[facility]-EMERGENCY DEPT Provider Note   CSN: [id number] Arrival date & time: 11/17/16  0755     History   Chief Complaint Chief Complaint  Patient presents with  . Abdominal Pain    HPI [name] [name] is a 36 y.o. female presenting to the ED for lower abdominal pain since 7:30 this morning. He states that her abdominal pain was a sudden onset, continuous, sharp, 10/10 pain. Patient states that she has noticed about 4 days of pink substance when she wipes after bowel movements. She noticed blood on toilet paper after wiping from a bowel movement, states that after that she had to urinate, wiping again, having nothing present on the toilet paper. She states that her last menstrual period was on December 12. She states that she thought she may be getting her period at this time, however this would be abnormal since she is due for her next period mid January. Patient's states that she is sure the bleeding is coming from her vagina and not rectally or from her urethra. She states that this pain is much more intense than her periods and states she can usually withstand the cramping abdominal pain from her periods without pain medication. She states that she was seen at [facility] about 2 years ago with similar complaints where she had a large blood clot coming out of her vagina. She is concerned this may be a pregnancy or a miscarriage. Patient denies chest pain, shortness of breath, fevers, chills, nausea, vomiting.  HPI  Past Medical History:  Diagnosis Date  . Chlamydia   . Gonorrhea   . Trichomonas     There are no active problems to display for this patient.   Past Surgical History:  Procedure Laterality Date  . CESAREAN SECTION  07/15/2012   Procedure: CESAREAN SECTION;  Surgeon: Kathreen Cosier, MD;  Location: [facility];  Service: Gynecology;  Laterality: N/A;    OB History    Gravida Para Term Preterm AB Living   1 1 1  0 0 1   SAB TAB Ectopic Multiple Live Births   0 0 0 0 1        Home Medications    Prior to Admission medications   Medication Sig Start Date End Date Taking? Authorizing Provider  diclofenac (VOLTAREN) 50 MG EC tablet Take 1 tablet (50 mg total) by mouth 2 (two) times daily. Patient not taking: Reported on 11/17/2016 03/13/16   Elson Areas, PA-C  hydrocortisone 1 % ointment Apply 1 application topically 2 (two) times daily. Patient not taking: Reported on 11/17/2016 06/02/16   Aviva Signs, CNM  ibuprofen (ADVIL,MOTRIN) 400 MG tablet Take 1 tablet (400 mg total) by mouth every 6 (six) hours as needed. 11/17/16   Baylyn Sickles Manuel [place], [place]  methocarbamol (ROBAXIN) 500 MG tablet Take 1 tablet (500 mg total) by mouth 2 (two) times daily. Patient not taking: Reported on 11/17/2016 03/13/16   Elson Areas, PA-C  metroNIDAZOLE (FLAGYL) 500 MG tablet Take 1 tablet (500 mg total) by mouth 2 (two) times daily. 11/17/16 11/24/16  Ryin Schillo Manuel [place], PA  ondansetron (ZOFRAN ODT) 4 MG disintegrating tablet Take 1 tablet (4 mg total) by mouth every 8 (eight) hours as needed for nausea or vomiting. 11/17/16   Shylynn Bruning Manuel [place], [place]  traMADol (ULTRAM) 50 MG tablet Take 1 tablet (50 mg total) by mouth every 6 (six) hours as needed. Patient not taking: Reported on 11/17/2016 03/07/16   Janne Napoleon, NP  Family History Family History  Problem Relation Age of Onset  . Diabetes Mother   . Heart disease Father   . Anesthesia problems Neg Hx     Social History Social History  Substance Use Topics  . Smoking status: Current Every Day Smoker    Packs/day: 0.25    Years: 10.00  . Smokeless tobacco: Never Used  . Alcohol use No     Comment: occ     Allergies   Tylenol [acetaminophen]   Review of Systems Review of Systems  Constitutional: Negative for chills and fever.  HENT: Negative for congestion.   Respiratory: Negative for cough and shortness of breath.   Cardiovascular: Negative for chest pain.  Gastrointestinal: Positive for abdominal  pain. Negative for blood in stool, nausea and vomiting.  Genitourinary: Negative for difficulty urinating and dysuria.  Musculoskeletal: Negative for back pain, neck pain and neck stiffness.  Skin: Negative for rash and wound.  Neurological: Negative for speech difficulty and weakness.  Psychiatric/Behavioral: Negative for agitation.     Physical Exam Updated Vital Signs BP 95/57 (BP Location: Left Arm)   Pulse 70   Temp 98.2 F (36.8 C) (Oral)   Resp 15   LMP  (Within Weeks)   SpO2 99%   Physical Exam  Constitutional: She is oriented to person, place, and time. She appears well-developed and well-nourished.  Appears uncomfortable.   HENT:  Head: Normocephalic and atraumatic.  Nose: Nose normal.  Eyes: Conjunctivae and EOM are normal. Pupils are equal, round, and reactive to light.  Neck: Normal range of motion. Neck supple.  Cardiovascular: Normal rate and normal heart sounds.   Pulmonary/Chest: Effort normal and breath sounds normal. No respiratory distress. She exhibits no tenderness.  Abdominal: Soft. There is tenderness. There is guarding. There is no rebound. Hernia confirmed negative in the right inguinal area and confirmed negative in the left inguinal area.  Genitourinary: Vagina normal. Pelvic exam was performed with patient supine. No labial fusion. There is no rash, tenderness, lesion or injury on the right labia. There is no rash, tenderness, lesion or injury on the left labia. Cervix exhibits no motion tenderness. Right adnexum displays tenderness. Right adnexum displays no mass. Left adnexum displays tenderness. Left adnexum displays no mass. No foreign body in the vagina. No signs of injury around the vagina. No vaginal discharge found.  Genitourinary Comments: Exam Chaperoned. Patient tolerated exam well.   Musculoskeletal: Normal range of motion.  Neurological: She is alert and oriented to person, place, and time.  Skin: Skin is warm. Capillary refill takes less  than 2 seconds.  Psychiatric: She has a normal mood and affect. Her behavior is normal.  Nursing note and vitals reviewed.    ED Treatments / Results  Labs (all labs ordered are listed, but only abnormal results are displayed) Labs Reviewed  WET PREP, GENITAL - Abnormal; Notable for the following:       Result Value   Clue Cells Wet Prep HPF POC PRESENT (*)    WBC, Wet Prep HPF POC MODERATE (*)    All other components within normal limits  URINALYSIS, ROUTINE W REFLEX MICROSCOPIC - Abnormal; Notable for the following:    Hgb urine dipstick MODERATE (*)    Bacteria, UA FEW (*)    Squamous Epithelial / LPF 0-5 (*)    All other components within normal limits  COMPREHENSIVE METABOLIC PANEL - Abnormal; Notable for the following:    Anion gap 4 (*)    All other components  within normal limits  CBC  RPR  HIV ANTIBODY (ROUTINE TESTING)  I-STAT BETA HCG BLOOD, ED ([facility], [facility], [facility] ONLY)  GC/CHLAMYDIA PROBE AMP ([place]) NOT AT [facility]    EKG  EKG Interpretation None       Radiology [place] Transvaginal Non-ob  Result Date: 11/17/2016 CLINICAL DATA:  Patient with sudden onset lower abdominal pain. Prior C-section. EXAM: TRANSABDOMINAL AND TRANSVAGINAL ULTRASOUND OF PELVIS DOPPLER ULTRASOUND OF OVARIES TECHNIQUE: Both transabdominal and transvaginal ultrasound examinations of the pelvis were performed. Transabdominal technique was performed for global imaging of the pelvis including uterus, ovaries, adnexal regions, and pelvic cul-de-sac. It was necessary to proceed with endovaginal exam following the transabdominal exam to visualize the adnexal structures. Color and duplex Doppler ultrasound was utilized to evaluate blood flow to the ovaries. COMPARISON:  Pelvic ultrasound 11/24/2011 FINDINGS: Uterus Measurements: 8.7 x 4.4 x 3.6 cm. No fibroids or other mass visualized. Endometrium Thickness: 14 mm.  No focal abnormality visualized. Right ovary Measurements: 2.2 x 1.9 x 1.5 cm. Normal  appearance/no adnexal mass. Left ovary Measurements: 4.3 x 3.0 x 3.0 cm. Corpus luteum within the left ovary. Pulsed Doppler evaluation of both ovaries demonstrates normal low-resistance arterial and venous waveforms. Other findings Trace free fluid in the pelvis. IMPRESSION: No sonographic evidence to suggest ovarian torsion. Corpus luteum within the left ovary. Electronically Signed   By: Annia Beltrew  Davis M.D.   On: 11/17/2016 11:04   [place] Pelvis Complete  Result Date: 11/17/2016 CLINICAL DATA:  Patient with sudden onset lower abdominal pain. Prior C-section. EXAM: TRANSABDOMINAL AND TRANSVAGINAL ULTRASOUND OF PELVIS DOPPLER ULTRASOUND OF OVARIES TECHNIQUE: Both transabdominal and transvaginal ultrasound examinations of the pelvis were performed. Transabdominal technique was performed for global imaging of the pelvis including uterus, ovaries, adnexal regions, and pelvic cul-de-sac. It was necessary to proceed with endovaginal exam following the transabdominal exam to visualize the adnexal structures. Color and duplex Doppler ultrasound was utilized to evaluate blood flow to the ovaries. COMPARISON:  Pelvic ultrasound 11/24/2011 FINDINGS: Uterus Measurements: 8.7 x 4.4 x 3.6 cm. No fibroids or other mass visualized. Endometrium Thickness: 14 mm.  No focal abnormality visualized. Right ovary Measurements: 2.2 x 1.9 x 1.5 cm. Normal appearance/no adnexal mass. Left ovary Measurements: 4.3 x 3.0 x 3.0 cm. Corpus luteum within the left ovary. Pulsed Doppler evaluation of both ovaries demonstrates normal low-resistance arterial and venous waveforms. Other findings Trace free fluid in the pelvis. IMPRESSION: No sonographic evidence to suggest ovarian torsion. Corpus luteum within the left ovary. Electronically Signed   By: Annia Beltrew  Davis M.D.   On: 11/17/2016 11:04   [place] Art/ven Flow Abd Pelv Doppler  Result Date: 11/17/2016 CLINICAL DATA:  Patient with sudden onset lower abdominal pain. Prior C-section. EXAM: TRANSABDOMINAL  AND TRANSVAGINAL ULTRASOUND OF PELVIS DOPPLER ULTRASOUND OF OVARIES TECHNIQUE: Both transabdominal and transvaginal ultrasound examinations of the pelvis were performed. Transabdominal technique was performed for global imaging of the pelvis including uterus, ovaries, adnexal regions, and pelvic cul-de-sac. It was necessary to proceed with endovaginal exam following the transabdominal exam to visualize the adnexal structures. Color and duplex Doppler ultrasound was utilized to evaluate blood flow to the ovaries. COMPARISON:  Pelvic ultrasound 11/24/2011 FINDINGS: Uterus Measurements: 8.7 x 4.4 x 3.6 cm. No fibroids or other mass visualized. Endometrium Thickness: 14 mm.  No focal abnormality visualized. Right ovary Measurements: 2.2 x 1.9 x 1.5 cm. Normal appearance/no adnexal mass. Left ovary Measurements: 4.3 x 3.0 x 3.0 cm. Corpus luteum within the left ovary. Pulsed Doppler evaluation  of both ovaries demonstrates normal low-resistance arterial and venous waveforms. Other findings Trace free fluid in the pelvis. IMPRESSION: No sonographic evidence to suggest ovarian torsion. Corpus luteum within the left ovary. Electronically Signed   By: Annia Belt M.D.   On: 11/17/2016 11:04    Procedures Procedures (including critical care time)  Medications Ordered in ED Medications  HYDROmorphone (DILAUDID) injection 1 mg (1 mg Intravenous Given 11/17/16 0857)  sodium chloride 0.9 % bolus 1,000 mL (0 mLs Intravenous Stopped 11/17/16 1113)  ondansetron (ZOFRAN-ODT) disintegrating tablet 4 mg (4 mg Oral Given 11/17/16 1129)     Initial Impression / Assessment and Plan / ED Course  I have reviewed the triage vital signs and the nursing notes.  Pertinent labs & imaging results that were available during my care of the patient were reviewed by me and considered in my medical decision making (see chart for details).  Clinical Course   Patient is a 36 yo female with presenting with pelvic pain and vaginal bleeding  since today at 7:30am. On exam heart and lungs are clear. Abdomen soft. Negative murphy's sign, no focal tenderness at McBurney's point. Tenderness to suprapubic area.  Blood present in vaginal canal and on cervix. Clue cells present on pelvic.  Pregnancy test negative. Labwork is unremarkable. Ultrasound negative for evidence of ovarian torsion, cyst, fibroids.  Patient will be given antibiotics and symptomatic treatment. Pt encouraged to follow up with PCP in 2-5 days. Return precautions given for any new or worsening symptoms. Patient able to tolerate PO challenge. Pt afebrile, VSS, NAD prior to discharge.   Patient also seen and evaluated by Dr. Adriana Simas.  Final Clinical Impressions(s) / ED Diagnoses   Final diagnoses:  Bacterial vaginosis    New Prescriptions Discharge Medication List as of 11/17/2016 11:32 AM    START taking these medications   Details  ibuprofen (ADVIL,MOTRIN) 400 MG tablet Take 1 tablet (400 mg total) by mouth every 6 (six) hours as needed., Starting Mon 11/17/2016, Print    metroNIDAZOLE (FLAGYL) 500 MG tablet Take 1 tablet (500 mg total) by mouth 2 (two) times daily., Starting Mon 11/17/2016, Until Mon 11/24/2016, Print    ondansetron (ZOFRAN ODT) 4 MG disintegrating tablet Take 1 tablet (4 mg total) by mouth every 8 (eight) hours as needed for nausea or vomiting., Starting Mon 11/17/2016, Print         [address] [place], [place] 11/17/16 [address] [place], [place] 11/17/16 1744    Donnetta Hutching, MD 11/19/16 [phone]

## 2016-11-17 NOTE — ED Notes (Addendum)
Kenn FileFranc PA at bedside

## 2016-11-18 LAB — GC/CHLAMYDIA PROBE AMP (~~LOC~~) NOT AT ARMC
Chlamydia: NEGATIVE
NEISSERIA GONORRHEA: NEGATIVE

## 2016-11-18 LAB — HIV ANTIBODY (ROUTINE TESTING W REFLEX): HIV SCREEN 4TH GENERATION: NONREACTIVE

## 2016-11-18 LAB — RPR: RPR: NONREACTIVE

## 2018-11-26 ENCOUNTER — Inpatient Hospital Stay (HOSPITAL_COMMUNITY)
Admission: AD | Admit: 2018-11-26 | Discharge: 2018-11-26 | Disposition: A | Discharge status: Home / Self Care | Payer: Self-pay | Attending: Obstetrics and Gynecology | Admitting: Obstetrics and Gynecology

## 2018-11-26 ENCOUNTER — Encounter (HOSPITAL_COMMUNITY): Payer: Self-pay | Admitting: *Deleted

## 2018-11-26 DIAGNOSIS — N920 Excessive and frequent menstruation with regular cycle: Secondary | ICD-10-CM

## 2018-11-26 DIAGNOSIS — F17210 Nicotine dependence, cigarettes, uncomplicated: Secondary | ICD-10-CM | POA: Insufficient documentation

## 2018-11-26 DIAGNOSIS — F1990 Other psychoactive substance use, unspecified, uncomplicated: Secondary | ICD-10-CM

## 2018-11-26 DIAGNOSIS — N923 Ovulation bleeding: Secondary | ICD-10-CM

## 2018-11-26 DIAGNOSIS — N939 Abnormal uterine and vaginal bleeding, unspecified: Secondary | ICD-10-CM | POA: Insufficient documentation

## 2018-11-26 DIAGNOSIS — Z3202 Encounter for pregnancy test, result negative: Secondary | ICD-10-CM | POA: Insufficient documentation

## 2018-11-26 HISTORY — DX: Other specified health status: Z78.9

## 2018-11-26 LAB — URINALYSIS, ROUTINE W REFLEX MICROSCOPIC
BACTERIA UA: NONE SEEN
Bilirubin Urine: NEGATIVE
Glucose, UA: NEGATIVE mg/dL
Ketones, ur: NEGATIVE mg/dL
Leukocytes, UA: NEGATIVE
Nitrite: NEGATIVE
PROTEIN: 30 mg/dL — AB
SPECIFIC GRAVITY, URINE: 1.025 (ref 1.005–1.030)
pH: 5 (ref 5.0–8.0)

## 2018-11-26 LAB — POCT PREGNANCY, URINE: PREG TEST UR: NEGATIVE

## 2018-11-26 LAB — WET PREP, GENITAL
CLUE CELLS WET PREP: NONE SEEN
Sperm: NONE SEEN
Trich, Wet Prep: NONE SEEN
YEAST WET PREP: NONE SEEN

## 2018-11-26 NOTE — Discharge Instructions (Signed)
Abnormal Uterine Bleeding  Abnormal uterine bleeding means bleeding more than usual from your uterus. It can include:   Bleeding between periods.   Bleeding after sex.   Bleeding that is heavier than normal.   Periods that last longer than usual.   Bleeding after you have stopped having your period (menopause).  There are many problems that may cause this. You should see a doctor for any kind of bleeding that is not normal. Treatment depends on the cause of the bleeding.  Follow these instructions at home:   Watch your condition for any changes.   Do not use tampons, douche, or have sex, if your doctor tells you not to.   Change your pads often.   Get regular well-woman exams. Make sure they include a pelvic exam and cervical cancer screening.   Keep all follow-up visits as told by your doctor. This is important.  Contact a doctor if:   The bleeding lasts more than one week.   You feel dizzy at times.   You feel like you are going to throw up (nauseous).   You throw up.  Get help right away if:   You pass out.   You have to change pads every hour.   You have belly (abdominal) pain.   You have a fever.   You get sweaty.   You get weak.   You passing large blood clots from your vagina.  Summary   Abnormal uterine bleeding means bleeding more than usual from your uterus.   There are many problems that may cause this. You should see a doctor for any kind of bleeding that is not normal.   Treatment depends on the cause of the bleeding.  This information is not intended to replace advice given to you by your health care provider. Make sure you discuss any questions you have with your health care provider.  Document Released: 08/31/2009 Document Revised: 10/28/2016 Document Reviewed: 10/28/2016  [employer]vier Interactive Patient Education  2019 [employer]

## 2018-11-26 NOTE — MAU Provider Note (Signed)
History     CSN: [id number]  Arrival date and time: 11/26/18 8242   First Provider Initiated Contact with Patient 11/26/18 [phone]      Chief Complaint  Patient presents with  . Vaginal Bleeding  . Abdominal Pain   HPI   Ms.[name] [name] is a 38 y.o. female. G1P1001 non pregnant female here with irregular spotting since Oct. She has had spotting in between periods. Her last period was the end of December, and then the spotting started shortly after that. She thought she had a yeast infection at that time and used some over the counter medications. 1 partner now. No pain.   Says she has been using cocaine a lot lately which she thinks may be contributing to the spotting.   OB History    Gravida  1   Para  1   Term  1   Preterm  0   AB  0   Living  1     SAB  0   TAB  0   Ectopic  0   Multiple  0   Live Births  1           Past Medical History:  Diagnosis Date  . Chlamydia   . Gonorrhea   . Medical history non-contributory   . Trichomonas     Past Surgical History:  Procedure Laterality Date  . CESAREAN SECTION  07/15/2012   Procedure: CESAREAN SECTION;  Surgeon: Kathreen Cosier, MD;  Location: [facility];  Service: Gynecology;  Laterality: N/A;    Family History  Problem Relation Age of Onset  . Diabetes Mother   . Heart disease Father   . Anesthesia problems Neg Hx     Social History   Tobacco Use  . Smoking status: Current Every Day Smoker    Packs/day: 0.25    Years: 10.00    Pack years: 2.50  . Smokeless tobacco: Never Used  Substance Use Topics  . Alcohol use: Yes    Comment: occ  . Drug use: Yes    Types: Marijuana, Cocaine    Comment: Last used both 11/25/18    Allergies:  Allergies  Allergen Reactions  . Tylenol [Acetaminophen] Nausea And Vomiting    Medications Prior to Admission  Medication Sig Dispense Refill Last Dose  . diclofenac (VOLTAREN) 50 MG EC tablet Take 1 tablet (50 mg total) by mouth 2 (two) times daily.  (Patient not taking: Reported on 11/17/2016) 20 tablet 0 Not Taking at Unknown time  . hydrocortisone 1 % ointment Apply 1 application topically 2 (two) times daily. (Patient not taking: Reported on 11/17/2016) 30 g 0 Not Taking at Unknown time  . ibuprofen (ADVIL,MOTRIN) 400 MG tablet Take 1 tablet (400 mg total) by mouth every 6 (six) hours as needed. 30 tablet 0   . methocarbamol (ROBAXIN) 500 MG tablet Take 1 tablet (500 mg total) by mouth 2 (two) times daily. (Patient not taking: Reported on 11/17/2016) 20 tablet 0 Not Taking at Unknown time  . ondansetron (ZOFRAN ODT) 4 MG disintegrating tablet Take 1 tablet (4 mg total) by mouth every 8 (eight) hours as needed for nausea or vomiting. 20 tablet 0   . traMADol (ULTRAM) 50 MG tablet Take 1 tablet (50 mg total) by mouth every 6 (six) hours as needed. (Patient not taking: Reported on 11/17/2016) 15 tablet 0 Not Taking at Unknown time   Results for orders placed or performed during the hospital encounter of 11/26/18 (from the past 48  hour(s))  Urinalysis, Routine w reflex microscopic     Status: Abnormal   Collection Time: 11/26/18  8:32 AM  Result Value Ref Range   Color, Urine YELLOW YELLOW   APPearance HAZY (A) CLEAR   Specific Gravity, Urine 1.025 1.005 - 1.030   pH 5.0 5.0 - 8.0   Glucose, UA NEGATIVE NEGATIVE mg/dL   Hgb urine dipstick LARGE (A) NEGATIVE   Bilirubin Urine NEGATIVE NEGATIVE   Ketones, ur NEGATIVE NEGATIVE mg/dL   Protein, ur 30 (A) NEGATIVE mg/dL   Nitrite NEGATIVE NEGATIVE   Leukocytes, UA NEGATIVE NEGATIVE   RBC / HPF 21-50 0 - 5 RBC/hpf   WBC, UA 11-20 0 - 5 WBC/hpf   Bacteria, UA NONE SEEN NONE SEEN   Squamous Epithelial / LPF 0-5 0 - 5   Mucus PRESENT     Comment: Performed at [facility] [address], [place], [place] [address]  Pregnancy, urine POC     Status: None   Collection Time: 11/26/18  8:50 AM  Result Value Ref Range   Preg Test, Ur NEGATIVE NEGATIVE    Comment:        THE SENSITIVITY OF  THIS METHODOLOGY IS >24 mIU/mL   Wet prep, genital     Status: Abnormal   Collection Time: 11/26/18  9:07 AM  Result Value Ref Range   Yeast Wet Prep HPF POC NONE SEEN NONE SEEN   Trich, Wet Prep NONE SEEN NONE SEEN   Clue Cells Wet Prep HPF POC NONE SEEN NONE SEEN   WBC, Wet Prep HPF POC FEW (A) NONE SEEN    Comment: MODERATE BACTERIA SEEN   Sperm NONE SEEN     Comment: Performed at [facility], [address], [place], [place] [address]    Review of Systems  Constitutional: Negative for fever.  Gastrointestinal: Negative for abdominal pain.  Genitourinary: Positive for vaginal bleeding.   Physical Exam   Blood pressure 104/61, pulse 69, temperature 98 F (36.7 C), temperature source Oral, resp. rate 16, height 4\' 11"  (1.499 m), weight 74.8 kg, last menstrual period 10/29/2018.  Physical Exam  Constitutional: She is oriented to person, place, and time. She appears well-developed and well-nourished. No distress.  HENT:  Head: Normocephalic.  Eyes: Pupils are equal, round, and reactive to light.  GI: Soft. She exhibits no distension. There is no abdominal tenderness. There is no rebound and no guarding.  Genitourinary:    Genitourinary Comments: Vagina - Small amount mucoid bloody vaginal discharge, no odor  Cervix - No contact bleeding, scant active bleeding  Bimanual exam: Cervix closed Uterus non tender, normal size Adnexa non tender, no masses bilaterally GC/Chlam, wet prep done Chaperone present for exam.    Musculoskeletal: Normal range of motion.  Neurological: She is alert and oriented to person, place, and time.  Skin: Skin is warm. She is not diaphoretic.  Psychiatric: Her behavior is normal.    MAU Course  Procedures  None  MDM  HIV, RPR Wet prep and GC Discussed the importance of GYN and PCP; discussed walk-in clinic hours.   Assessment and Plan   A:  1. Spotting between menses   2. Drug use     P:  Discharge home in stable  condition Discussed risks of cocaine use She declines BC pills Establish care with GYN for Annual exam, may need full workup including pelvic US  Haille Pardi, Harolyn Rutherford, NP 11/26/2018 2:05 PM

## 2018-11-26 NOTE — MAU Note (Addendum)
Pt states her period ended December 19th, two days later started spotting & this has continued ever since, not every single day.  Started having abdominal pain this morning.

## 2018-11-27 LAB — HIV ANTIBODY (ROUTINE TESTING W REFLEX): HIV Screen 4th Generation wRfx: NONREACTIVE

## 2018-11-27 LAB — RPR: RPR Ser Ql: NONREACTIVE

## 2018-11-29 LAB — GC/CHLAMYDIA PROBE AMP (~~LOC~~) NOT AT ARMC
Chlamydia: NEGATIVE
Neisseria Gonorrhea: NEGATIVE

## 2019-05-01 ENCOUNTER — Emergency Department (HOSPITAL_COMMUNITY)
Admission: EM | Admit: 2019-05-01 | Discharge: 2019-05-01 | Disposition: A | Discharge status: Home / Self Care | Payer: Self-pay | Attending: Emergency Medicine | Admitting: Emergency Medicine

## 2019-05-01 ENCOUNTER — Other Ambulatory Visit: Payer: Self-pay

## 2019-05-01 ENCOUNTER — Encounter (HOSPITAL_COMMUNITY): Payer: Self-pay

## 2019-05-01 DIAGNOSIS — K859 Acute pancreatitis without necrosis or infection, unspecified: Secondary | ICD-10-CM | POA: Insufficient documentation

## 2019-05-01 DIAGNOSIS — F1210 Cannabis abuse, uncomplicated: Secondary | ICD-10-CM | POA: Insufficient documentation

## 2019-05-01 DIAGNOSIS — N946 Dysmenorrhea, unspecified: Secondary | ICD-10-CM | POA: Insufficient documentation

## 2019-05-01 DIAGNOSIS — R300 Dysuria: Secondary | ICD-10-CM | POA: Insufficient documentation

## 2019-05-01 DIAGNOSIS — F17210 Nicotine dependence, cigarettes, uncomplicated: Secondary | ICD-10-CM | POA: Insufficient documentation

## 2019-05-01 DIAGNOSIS — F1410 Cocaine abuse, uncomplicated: Secondary | ICD-10-CM | POA: Insufficient documentation

## 2019-05-01 LAB — COMPREHENSIVE METABOLIC PANEL
ALT: 16 U/L (ref 0–44)
AST: 18 U/L (ref 15–41)
Albumin: 4.1 g/dL (ref 3.5–5.0)
Alkaline Phosphatase: 60 U/L (ref 38–126)
Anion gap: 5 (ref 5–15)
BUN: 12 mg/dL (ref 6–20)
CO2: 23 mmol/L (ref 22–32)
Calcium: 8.9 mg/dL (ref 8.9–10.3)
Chloride: 109 mmol/L (ref 98–111)
Creatinine, Ser: 0.8 mg/dL (ref 0.44–1.00)
GFR calc Af Amer: >60 mL/min (ref >60)
GFR calc non Af Amer: >60 mL/min (ref >60)
Glucose, Bld: 101 mg/dL — ABNORMAL HIGH (ref 70–99)
Potassium: 4 mmol/L (ref 3.5–5.1)
Sodium: 137 mmol/L (ref 135–145)
Total Bilirubin: 0.6 mg/dL (ref 0.3–1.2)
Total Protein: 7.9 g/dL (ref 6.5–8.1)

## 2019-05-01 LAB — CBC
HCT: 36.4 % (ref 36.0–46.0)
Hemoglobin: 11.3 g/dL — ABNORMAL LOW (ref 12.0–15.0)
MCH: 26.7 pg (ref 26.0–34.0)
MCHC: 31 g/dL (ref 30.0–36.0)
MCV: 86.1 fL (ref 80.0–100.0)
Platelets: 157 10*3/uL (ref 150–400)
RBC: 4.23 MIL/uL (ref 3.87–5.11)
RDW: 15.2 % (ref 11.5–15.5)
WBC: 6.2 10*3/uL (ref 4.0–10.5)
nRBC: 0 % (ref 0.0–0.2)

## 2019-05-01 LAB — URINALYSIS, ROUTINE W REFLEX MICROSCOPIC
Bilirubin Urine: NEGATIVE
Glucose, UA: NEGATIVE mg/dL
Ketones, ur: NEGATIVE mg/dL
Leukocytes,Ua: NEGATIVE
Nitrite: NEGATIVE
Protein, ur: NEGATIVE mg/dL
Specific Gravity, Urine: 1.021 (ref 1.005–1.030)
pH: 6 (ref 5.0–8.0)

## 2019-05-01 LAB — WET PREP, GENITAL
Clue Cells Wet Prep HPF POC: NONE SEEN
Sperm: NONE SEEN
Trich, Wet Prep: NONE SEEN
WBC, Wet Prep HPF POC: NONE SEEN
Yeast Wet Prep HPF POC: NONE SEEN

## 2019-05-01 LAB — LIPASE, BLOOD: Lipase: 53 U/L — ABNORMAL HIGH (ref 11–51)

## 2019-05-01 LAB — I-STAT BETA HCG BLOOD, ED (MC, WL, AP ONLY): I-stat hCG, quantitative: <5 m[IU]/mL (ref <5)

## 2019-05-01 MED ORDER — IBUPROFEN 800 MG PO TABS
800.0000 mg | ORAL_TABLET | Freq: Once | ORAL | Status: AC
  Administered 2019-05-01: 800 mg via ORAL
  Filled 2019-05-01: qty 1

## 2019-05-01 MED ORDER — ONDANSETRON 4 MG PO TBDP: 4.0000 mg | ORAL_TABLET | Freq: Three times a day (TID) | ORAL | 0 refills | Status: DC | PRN

## 2019-05-01 MED ORDER — IBUPROFEN 800 MG PO TABS: 800.0000 mg | ORAL_TABLET | Freq: Three times a day (TID) | ORAL | 0 refills | Status: DC

## 2019-05-01 MED ORDER — SODIUM CHLORIDE 0.9% FLUSH: 3.0000 mL | Freq: Once | INTRAVENOUS | Status: DC

## 2019-05-01 MED ORDER — TRAMADOL HCL 50 MG PO TABS: 50.0000 mg | ORAL_TABLET | Freq: Four times a day (QID) | ORAL | 0 refills | Status: DC | PRN

## 2019-05-01 NOTE — ED Triage Notes (Addendum)
Per EMS- patient is from home with c/o lower abdominal pain. Patient stated that she got up this AM and urinated and then she had a sudden onset of lower abdominal pain and cramping similar to what she experiences when  She is on her period.

## 2019-05-01 NOTE — ED Notes (Signed)
ED Provider at bedside. 

## 2019-05-01 NOTE — ED Provider Notes (Signed)
[place] DEPT Provider Note   CSN: [id number] Arrival date & time: 05/01/19  1045     History   Chief Complaint Chief Complaint  Patient presents with  . Abdominal Pain    HPI [name] [name] is a 38 y.o. female.     The history is provided by the patient. No language interpreter was used.  Abdominal Pain Pain location:  Suprapubic Pain quality: aching   Pain radiates to:  Does not radiate Pain severity:  Moderate Onset quality:  Gradual Timing:  Constant Progression:  Worsening Chronicity:  New Relieved by:  Nothing Worsened by:  Nothing Ineffective treatments:  None tried Associated symptoms: dysuria   Associated symptoms: no cough, no diarrhea and no vaginal discharge   Risk factors: has not had multiple surgeries    Pt complains of lower abdominal pain.  Pt reports she has started spotting since she has been here.  Pt reports pain is improving after taking midol.  Past Medical History:  Diagnosis Date  . Chlamydia   . Gonorrhea   . Medical history non-contributory   . Trichomonas     There are no active problems to display for this patient.   Past Surgical History:  Procedure Laterality Date  . CESAREAN SECTION  07/15/2012   Procedure: CESAREAN SECTION;  Surgeon: Frederico Hamman, MD;  Location: [facility];  Service: Gynecology;  Laterality: N/A;     OB History    Gravida  1   Para  1   Term  1   Preterm  0   AB  0   Living  1     SAB  0   TAB  0   Ectopic  0   Multiple  0   Live Births  1            Home Medications    Prior to Admission medications   Medication Sig Start Date End Date Taking? Authorizing Provider  ibuprofen (ADVIL,MOTRIN) 400 MG tablet Take 1 tablet (400 mg total) by mouth every 6 (six) hours as needed. 11/17/16  Yes Espina, Kemper Durie, [place]    Family History Family History  Problem Relation Age of Onset  . Diabetes Mother   . Heart disease Father   . Anesthesia  problems Neg Hx     Social History Social History   Tobacco Use  . Smoking status: Current Every Day Smoker    Packs/day: 0.25    Years: 10.00    Pack years: 2.50  . Smokeless tobacco: Never Used  Substance Use Topics  . Alcohol use: Yes    Comment: occ  . Drug use: Yes    Types: Marijuana, Cocaine    Comment: occasionally     Allergies   Tylenol [acetaminophen]   Review of Systems Review of Systems  Respiratory: Negative for cough.   Gastrointestinal: Positive for abdominal pain. Negative for diarrhea.  Genitourinary: Positive for dysuria. Negative for vaginal discharge.  All other systems reviewed and are negative.    Physical Exam Updated Vital Signs BP 111/87 (BP Location: Right Arm)   Pulse 77   Temp 98.3 F (36.8 C) (Oral)   Resp 17   Ht 4\' 9"  (1.448 m)   Wt 72.6 kg   LMP 04/06/2019 (Approximate)   SpO2 100%   BMI 34.62 kg/m   Physical Exam Vitals signs and nursing note reviewed.  Constitutional:      Appearance: She is well-developed.  HENT:     Head:  Normocephalic.  Neck:     Musculoskeletal: Normal range of motion.  Cardiovascular:     Rate and Rhythm: Normal rate.  Pulmonary:     Effort: Pulmonary effort is normal.  Abdominal:     General: Abdomen is flat. Bowel sounds are normal. There is no distension.     Palpations: Abdomen is soft.     Tenderness: There is abdominal tenderness in the suprapubic area.  Genitourinary:    Vagina: Normal.     Cervix: Normal.     Comments: Small amount of dark blood  Adnexa nontender, no mass Musculoskeletal: Normal range of motion.  Skin:    General: Skin is warm.  Neurological:     General: No focal deficit present.     Mental Status: She is alert and oriented to person, place, and time.      ED Treatments / Results  Labs (all labs ordered are listed, but only abnormal results are displayed) Labs Reviewed  LIPASE, BLOOD - Abnormal; Notable for the following components:      Result Value    Lipase 53 (*)    All other components within normal limits  COMPREHENSIVE METABOLIC PANEL - Abnormal; Notable for the following components:   Glucose, Bld 101 (*)    All other components within normal limits  CBC - Abnormal; Notable for the following components:   Hemoglobin 11.3 (*)    All other components within normal limits  URINALYSIS, ROUTINE W REFLEX MICROSCOPIC - Abnormal; Notable for the following components:   Hgb urine dipstick LARGE (*)    Bacteria, UA RARE (*)    All other components within normal limits  WET PREP, GENITAL  I-STAT BETA HCG BLOOD, ED ([facility], [facility], [facility] ONLY)  GC/CHLAMYDIA PROBE AMP ([place]    EKG    Radiology No results found.  Procedures Procedures (including critical care time)  Medications Ordered in ED Medications  sodium chloride flush (NS) 0.9 % injection 3 mL (3 mLs Intravenous Not Given 05/01/19 1332)     Initial Impression / Assessment and Plan / ED Course  I have reviewed the triage vital signs and the nursing notes.  Pertinent labs & imaging results that were available during my care of the patient were reviewed by me and considered in my medical decision making (see chart for details).  Clinical Course as of Apr 30 1418  Sun May 01, 2019  1354 Lipase(!): 53 [LS]    Clinical Course User Index [LS] Elson AreasSofia, Maxemiliano Riel K, [place]-C       MDM  Pt has slightly elevated lipase.   I suspect pain is menstrual cramping however pt will need followup.  Pt advised to avoid alcohol   Final Clinical Impressions(s) / ED Diagnoses   Final diagnoses:  Menstrual cramps  Acute pancreatitis, unspecified complication status, unspecified pancreatitis type    ED Discharge Orders         Ordered    ibuprofen (ADVIL) 800 MG tablet  3 times daily     05/01/19 1430    ondansetron (ZOFRAN ODT) 4 MG disintegrating tablet  Every 8 hours PRN     05/01/19 1430    traMADol (ULTRAM) 50 MG tablet  Every 6 hours PRN     05/01/19 1430        An  After Visit Summary was printed and given to the patient.    Elson AreasSofia, Gaynor Ferreras K, [place]-C 05/01/19 1431    Vanetta MuldersZackowski, Scott, MD 05/02/19 [phone]

## 2019-05-01 NOTE — Discharge Instructions (Signed)
See your Physician for recheck this week  return if any problems.

## 2019-05-02 LAB — GC/CHLAMYDIA PROBE AMP (~~LOC~~) NOT AT ARMC
Chlamydia: NEGATIVE
Neisseria Gonorrhea: NEGATIVE

## 2019-08-21 ENCOUNTER — Emergency Department (HOSPITAL_COMMUNITY)
Admission: EM | Admit: 2019-08-21 | Discharge: 2019-08-21 | Disposition: A | Discharge status: Home / Self Care | Payer: Self-pay | Attending: Emergency Medicine | Admitting: Emergency Medicine

## 2019-08-21 ENCOUNTER — Other Ambulatory Visit: Payer: Self-pay

## 2019-08-21 ENCOUNTER — Encounter (HOSPITAL_COMMUNITY): Payer: Self-pay | Admitting: Emergency Medicine

## 2019-08-21 DIAGNOSIS — N946 Dysmenorrhea, unspecified: Secondary | ICD-10-CM | POA: Insufficient documentation

## 2019-08-21 DIAGNOSIS — Z113 Encounter for screening for infections with a predominantly sexual mode of transmission: Secondary | ICD-10-CM | POA: Insufficient documentation

## 2019-08-21 DIAGNOSIS — R1030 Lower abdominal pain, unspecified: Secondary | ICD-10-CM

## 2019-08-21 DIAGNOSIS — F17210 Nicotine dependence, cigarettes, uncomplicated: Secondary | ICD-10-CM | POA: Insufficient documentation

## 2019-08-21 LAB — I-STAT BETA HCG BLOOD, ED (MC, WL, AP ONLY): I-stat hCG, quantitative: <5 m[IU]/mL (ref <5)

## 2019-08-21 LAB — WET PREP, GENITAL
Clue Cells Wet Prep HPF POC: NONE SEEN
Sperm: NONE SEEN
Trich, Wet Prep: NONE SEEN
Yeast Wet Prep HPF POC: NONE SEEN

## 2019-08-21 LAB — CBC
HCT: 37 % (ref 36.0–46.0)
Hemoglobin: 11.3 g/dL — ABNORMAL LOW (ref 12.0–15.0)
MCH: 26.7 pg (ref 26.0–34.0)
MCHC: 30.5 g/dL (ref 30.0–36.0)
MCV: 87.5 fL (ref 80.0–100.0)
Platelets: 195 10*3/uL (ref 150–400)
RBC: 4.23 MIL/uL (ref 3.87–5.11)
RDW: 15.4 % (ref 11.5–15.5)
WBC: 7 10*3/uL (ref 4.0–10.5)
nRBC: 0 % (ref 0.0–0.2)

## 2019-08-21 LAB — URINALYSIS, ROUTINE W REFLEX MICROSCOPIC
Bilirubin Urine: NEGATIVE
Glucose, UA: NEGATIVE mg/dL
Ketones, ur: NEGATIVE mg/dL
Nitrite: NEGATIVE
Protein, ur: 100 mg/dL — AB
RBC / HPF: >50 RBC/hpf — ABNORMAL HIGH (ref 0–5)
Specific Gravity, Urine: 1.003 — ABNORMAL LOW (ref 1.005–1.030)
pH: 6 (ref 5.0–8.0)

## 2019-08-21 LAB — COMPREHENSIVE METABOLIC PANEL
ALT: 17 U/L (ref 0–44)
AST: 19 U/L (ref 15–41)
Albumin: 4.1 g/dL (ref 3.5–5.0)
Alkaline Phosphatase: 57 U/L (ref 38–126)
Anion gap: 5 (ref 5–15)
BUN: 11 mg/dL (ref 6–20)
CO2: 25 mmol/L (ref 22–32)
Calcium: 9 mg/dL (ref 8.9–10.3)
Chloride: 107 mmol/L (ref 98–111)
Creatinine, Ser: 0.88 mg/dL (ref 0.44–1.00)
GFR calc Af Amer: >60 mL/min (ref >60)
GFR calc non Af Amer: >60 mL/min (ref >60)
Glucose, Bld: 99 mg/dL (ref 70–99)
Potassium: 3.5 mmol/L (ref 3.5–5.1)
Sodium: 137 mmol/L (ref 135–145)
Total Bilirubin: 0.7 mg/dL (ref 0.3–1.2)
Total Protein: 7.9 g/dL (ref 6.5–8.1)

## 2019-08-21 LAB — LIPASE, BLOOD: Lipase: 47 U/L (ref 11–51)

## 2019-08-21 MED ORDER — DICYCLOMINE HCL 10 MG PO CAPS
10.0000 mg | ORAL_CAPSULE | Freq: Once | ORAL | Status: AC
  Administered 2019-08-21: 07:00:00 10 mg via ORAL
  Filled 2019-08-21: qty 1

## 2019-08-21 MED ORDER — KETOROLAC TROMETHAMINE 60 MG/2ML IM SOLN
30.0000 mg | Freq: Once | INTRAMUSCULAR | Status: AC
  Administered 2019-08-21: 30 mg via INTRAMUSCULAR
  Filled 2019-08-21: qty 2

## 2019-08-21 MED ORDER — SODIUM CHLORIDE 0.9% FLUSH: 3.0000 mL | Freq: Once | INTRAVENOUS | Status: DC

## 2019-08-21 NOTE — ED Triage Notes (Signed)
Pt presents by [facility] with abdominal pain that started 1 hr ago.  Pt reported to EMS starting her period and pain started in lower abdomen and radiated up. Pt reports nausea but no vomiting.

## 2019-08-21 NOTE — Discharge Instructions (Signed)
°  Antiinflammatory medications: Take 600 mg of ibuprofen every 6 hours or 440 mg (over the counter dose) to 500 mg (prescription dose) of naproxen every 12 hours for the next 3 days. After this time, these medications may be used as needed for pain. Take these medications with food to avoid upset stomach. Choose only one of these medications, do not take them together. Acetaminophen (generic for Tylenol): Should you continue to have additional pain while taking the ibuprofen or naproxen, you may add in acetaminophen as needed. Your daily total maximum amount of acetaminophen from all sources should be limited to 4000mg /day for persons without liver problems, or 2000mg /day for those with liver problems.  Heating pad to the lower abdomen may improve symptoms.  Follow-up with OB/GYN for any further management of this issue.  Return to the emergency department for increased pain, fever, uncontrolled vomiting, bloody diarrhea, or any other major concerns.

## 2019-08-21 NOTE — ED Provider Notes (Signed)
[name] [name] is a 38 y.o. female, presenting to the ED with lower abdominal pain beginning last night.  Upon my interview, patient states pain is mild, rated 2/10, cramping.  She also reveals to me that she had similarly painful onset of her menstrual cycle last month as well.    HPI from Frederik Pear, PA-C: "[name] [name] is a 38 y.o. female with a history of STIs who presents to the emergency department with a chief complaint of abdominal pain.  The patient reports that she developed some burning, sharp pain in the bilateral pelvic regions of her lower abdomen earlier tonight.  She states that when the pain intensifies that she became nauseated and went to the bathroom because she felt as if she had to have a bowel movement.  She reports that she was unable to have a bowel movement, and the nausea resolved.  She reports that she was walking back down the hall when the pain worsened and the intensity of the pain caused her to fall on the floor.  She reports that her sister became concerned and called EMS.  She denies fever, chills, vomiting, dysuria, hematuria, vaginal itching, or pain.  She reports that she has been constipated over the last week, but had a normal, soft bowel movement yesterday.  She reports that nausea has since resolved.  She no longer has the urge to defecate.  Pain has significantly improved from previous despite no medication.  She also notes that she started her menstrual cycle earlier in the day.  She does note that she had some dark brown vaginal discharge over the last few days prior to the onset of her menstrual cycle.  She states that she does not take any medication for menstrual cramps because she does not want to be dependent on medication in case the cramps come on and she does not have any medication with her at that time.  She is sexually active with one female partner.  They frequently use protection, but she does report that she had unprotected sex 24 hours  ago.  She has had multiple STIs in the past, but has low concern for vaginitis at this time."  Past Medical History:  Diagnosis Date  . Chlamydia   . Gonorrhea   . Medical history non-contributory   . Trichomonas     Physical Exam  BP 100/68 (BP Location: Left Arm)   Pulse 81   Temp 98.4 F (36.9 C) (Oral)   Resp 18   Ht 4\' 9"  (1.448 m)   Wt 77.1 kg   LMP 08/20/2019   SpO2 98%   BMI 36.79 kg/m   Physical Exam Vitals signs and nursing note reviewed.  Constitutional:      General: She is not in acute distress.    Appearance: She is well-developed. She is not diaphoretic.  HENT:     Head: Normocephalic and atraumatic.     Mouth/Throat:     Mouth: Mucous membranes are moist.     Pharynx: Oropharynx is clear.  Eyes:     Conjunctiva/sclera: Conjunctivae normal.  Neck:     Musculoskeletal: Neck supple.  Cardiovascular:     Rate and Rhythm: Normal rate and regular rhythm.     Pulses: Normal pulses.          Radial pulses are 2+ on the right side and 2+ on the left side.       Posterior tibial pulses are 2+ on the right side and 2+ on the left  side.     Heart sounds: Normal heart sounds.     Comments: Tactile temperature in the extremities appropriate and equal bilaterally. Pulmonary:     Effort: Pulmonary effort is normal. No respiratory distress.     Breath sounds: Normal breath sounds.  Abdominal:     Palpations: Abdomen is soft.     Tenderness: There is abdominal tenderness (mild) in the suprapubic area. There is no guarding.  Musculoskeletal:     Right lower leg: No edema.     Left lower leg: No edema.  Lymphadenopathy:     Cervical: No cervical adenopathy.  Skin:    General: Skin is warm and dry.  Neurological:     Mental Status: She is alert.  Psychiatric:        Mood and Affect: Mood and affect normal.        Speech: Speech normal.        Behavior: Behavior normal.     ED Course/Procedures     Procedures  Abnormal Labs Reviewed  WET PREP,  GENITAL - Abnormal; Notable for the following components:      Result Value   WBC, Wet Prep HPF POC MANY (*)    All other components within normal limits  CBC - Abnormal; Notable for the following components:   Hemoglobin 11.3 (*)    All other components within normal limits  URINALYSIS, ROUTINE W REFLEX MICROSCOPIC - Abnormal; Notable for the following components:   Color, Urine PINK (*)    Specific Gravity, Urine 1.003 (*)    Hgb urine dipstick LARGE (*)    Protein, ur 100 (*)    Leukocytes,Ua TRACE (*)    RBC / HPF >50 (*)    Bacteria, UA RARE (*)    All other components within normal limits    No results found.  MDM   Clinical Course as of Aug 20 1018  Wynelle LinkSun Aug 21, 2019  [id number] Patient is currently menstruating.  Hgb urine dipstick(!): LARGE [SJ]  0835 Patient was noted to be laying on her side during this blood pressure.  I had the patient lie supine and obtained blood pressure of 112/74.  BP: 96/62 [SJ]    Clinical Course User Index [SJ] Anselm PancoastJoy, Hershey Knauer C, PA-C   Patient care handoff report received from [facility] McDonald, PA-C. Plan: Review wet prep and UA results.   Patient presents with lower abdominal pain beginning last night.  Pain improved significantly and has not resurged in intensity. Patient is nontoxic appearing, afebrile, not tachycardic, not tachypneic, not hypotensive, maintains excellent SPO2 on room air, and is in no apparent distress.  No leukocytosis. WBCs on wet prep nonspecific.  GC/chlamydia results pending.  Reportedly, no adnexal tenderness or CMT during pelvic exam. Regardless, I discussed preemptively treating the patient for GC/chlamydia, but patient declined. Shared decision-making was used regarding imaging studies here in the ED.  Based on the timing and onset of symptoms with some spontaneous improvement, combined with physical exam findings, my suspicion for surgical abdomen is low at this time, however, I did discuss with the patient the inability for  us to rule these things out with the current work-up.  She voiced understanding and declined imaging at this time.  Due to the pain associated with her menstrual cycle this month and last month, she will follow-up with OB/GYN. The patient was given instructions for home care as well as strict return precautions. Patient voices understanding of these instructions, accepts the plan, and is comfortable with  discharge.    Vitals:   08/21/19 0322 08/21/19 0324 08/21/19 0830 08/21/19 0929  BP: 100/68  96/62 112/74  Pulse: 81  75 69  Resp: 18  18 16   Temp: 98.4 F (36.9 C)  98.6 F (37 C) 98.5 F (36.9 C)  TempSrc: Oral  Oral Oral  SpO2: 98%  100% 100%  Weight:  77.1 kg    Height:  4\' 9"  (1.448 m)        Lorayne Bender, PA-C 08/21/19 1020    Lajean Saver, MD 08/21/19 1336

## 2019-08-21 NOTE — ED Provider Notes (Signed)
[facility] DEPT Provider Note   CSN: [id number] Arrival date & time: 08/21/19  0253     History   Chief Complaint Chief Complaint  Patient presents with  . Abdominal Pain    HPI [name] [name] is a 38 y.o. female with a history of STIs who presents to the emergency department with a chief complaint of abdominal pain.  The patient reports that she developed some burning, sharp pain in the bilateral pelvic regions of her lower abdomen earlier tonight.  She states that when the pain intensifies that she became nauseated and went to the bathroom because she felt as if she had to have a bowel movement.  She reports that she was unable to have a bowel movement, and the nausea resolved.  She reports that she was walking back down the hall when the pain worsened and the intensity of the pain caused her to fall on the floor.  She reports that her sister became concerned and called EMS.  She denies fever, chills, vomiting, dysuria, hematuria, vaginal itching, or pain.  She reports that she has been constipated over the last week, but had a normal, soft bowel movement yesterday.  She reports that nausea has since resolved.  She no longer has the urge to defecate.  Pain has significantly improved from previous despite no medication.  She also notes that she started her menstrual cycle earlier in the day.  She does note that she had some dark brown vaginal discharge over the last few days prior to the onset of her menstrual cycle.  She states that she does not take any medication for menstrual cramps because she does not want to be dependent on medication in case the cramps come on and she does not have any medication with her at that time.  She is sexually active with one female partner.  They frequently use protection, but she does report that she had unprotected sex 24 hours ago.  She has had multiple STIs in the past, but has low concern for vaginitis at this time.      The history is provided by the patient. No language interpreter was used.    Past Medical History:  Diagnosis Date  . Chlamydia   . Gonorrhea   . Medical history non-contributory   . Trichomonas     There are no active problems to display for this patient.   Past Surgical History:  Procedure Laterality Date  . CESAREAN SECTION  07/15/2012   Procedure: CESAREAN SECTION;  Surgeon: Frederico Hamman, MD;  Location: [facility];  Service: Gynecology;  Laterality: N/A;     OB History    Gravida  1   Para  1   Term  1   Preterm  0   AB  0   Living  1     SAB  0   TAB  0   Ectopic  0   Multiple  0   Live Births  1            Home Medications    Prior to Admission medications   Medication Sig Start Date End Date Taking? Authorizing Provider  ibuprofen (ADVIL) 800 MG tablet Take 1 tablet (800 mg total) by mouth 3 (three) times daily. Patient not taking: Reported on 08/21/2019 05/01/19   Fransico Meadow, PA-C  ondansetron (ZOFRAN ODT) 4 MG disintegrating tablet Take 1 tablet (4 mg total) by mouth every 8 (eight) hours as needed for nausea or  vomiting. Patient not taking: Reported on 08/21/2019 05/01/19   Elson Areas, PA-C  traMADol (ULTRAM) 50 MG tablet Take 1 tablet (50 mg total) by mouth every 6 (six) hours as needed for severe pain. Patient not taking: Reported on 08/21/2019 05/01/19   Osie Cheeks    Family History Family History  Problem Relation Age of Onset  . Diabetes Mother   . Heart disease Father   . Anesthesia problems Neg Hx     Social History Social History   Tobacco Use  . Smoking status: Current Every Day Smoker    Packs/day: 0.25    Years: 10.00    Pack years: 2.50  . Smokeless tobacco: Never Used  Substance Use Topics  . Alcohol use: Yes    Comment: occ  . Drug use: Yes    Types: Marijuana, Cocaine    Comment: occasionally     Allergies   Tylenol [acetaminophen]   Review of Systems Review of Systems   Constitutional: Negative for activity change, chills and fever.  Respiratory: Negative for shortness of breath.   Cardiovascular: Negative for chest pain, palpitations and leg swelling.  Gastrointestinal: Positive for abdominal pain, constipation and nausea. Negative for diarrhea and vomiting.  Genitourinary: Positive for vaginal bleeding. Negative for dysuria, urgency, vaginal discharge and vaginal pain.  Musculoskeletal: Negative for back pain, myalgias, neck pain and neck stiffness.  Skin: Negative for rash.  Allergic/Immunologic: Negative for immunocompromised state.  Neurological: Negative for dizziness, syncope, weakness and headaches.  Psychiatric/Behavioral: Negative for confusion.   Physical Exam Updated Vital Signs BP 100/68 (BP Location: Left Arm)   Pulse 81   Temp 98.4 F (36.9 C) (Oral)   Resp 18   Ht 4\' 9"  (1.448 m)   Wt 77.1 kg   LMP 08/20/2019   SpO2 98%   BMI 36.79 kg/m   Physical Exam Vitals signs and nursing note reviewed. Exam conducted with a chaperone present.  Constitutional:      General: She is not in acute distress.    Appearance: She is not ill-appearing, toxic-appearing or diaphoretic.  HENT:     Head: Normocephalic.  Eyes:     Conjunctiva/sclera: Conjunctivae normal.  Neck:     Musculoskeletal: Neck supple.  Cardiovascular:     Rate and Rhythm: Normal rate and regular rhythm.     Pulses: Normal pulses.     Heart sounds: Normal heart sounds. No murmur. No friction rub. No gallop.   Pulmonary:     Effort: Pulmonary effort is normal. No respiratory distress.     Breath sounds: No stridor. No wheezing, rhonchi or rales.  Chest:     Chest wall: No tenderness.  Abdominal:     General: There is no distension.     Palpations: Abdomen is soft. There is no mass.     Tenderness: There is abdominal tenderness. There is no right CVA tenderness, left CVA tenderness, guarding or rebound.     Hernia: No hernia is present.     Comments: Tender palpation  in the bilateral pelvic regions without rebound or guarding.  Abdomen is soft and nondistended.  No tenderness over McBurney's point.  Negative Murphy sign.  No CVA tenderness bilaterally. Hyperactive bowel sounds in all 4 quadrants.  Genitourinary:    Comments: Chaperoned exam.  There is a moderate amount of dark blood in the vaginal vault that appears to have an intrauterine etiology.  No lacerations or wounds.  No cervical motion tenderness.  No adnexal fullness or  tenderness bilaterally. Skin:    General: Skin is warm.     Findings: No rash.  Neurological:     Mental Status: She is alert.  Psychiatric:        Behavior: Behavior normal.      ED Treatments / Results  Labs (all labs ordered are listed, but only abnormal results are displayed) Labs Reviewed  WET PREP, GENITAL - Abnormal; Notable for the following components:      Result Value   WBC, Wet Prep HPF POC MANY (*)    All other components within normal limits  CBC - Abnormal; Notable for the following components:   Hemoglobin 11.3 (*)    All other components within normal limits  LIPASE, BLOOD  COMPREHENSIVE METABOLIC PANEL  URINALYSIS, ROUTINE W REFLEX MICROSCOPIC  I-STAT BETA HCG BLOOD, ED ([facility], [facility], [facility] ONLY)  GC/CHLAMYDIA PROBE AMP ([place] NOT [facility]    EKG None  Radiology No results found.  Procedures Procedures (including critical care time)  Medications Ordered in ED Medications  sodium chloride flush (NS) 0.9 % injection 3 mL (has no administration in time range)  dicyclomine (BENTYL) capsule 10 mg (10 mg Oral Given 08/21/19 [id number])  ketorolac (TORADOL) injection 30 mg (30 mg Intramuscular Given 08/21/19 0745)     Initial Impression / Assessment and Plan / ED Course  I have reviewed the triage vital signs and the nursing notes.  Pertinent labs & imaging results that were available during my care of the patient were reviewed by me and considered in my medical decision making (see chart for  details).        38 year old female with a history of STIs who presents to the emergency department with a chief complaint of abdominal pain and nausea.  She had an episode earlier tonight where her pain intensified and she felt the urge to have a bowel movement.  She had no vomiting.  She has had some recent constipation, but has been passing flatus and had a normal bowel movement yesterday.  No constitutional symptoms.   Labs in the ER reassuring.  She does not have a surgical abdomen on physical exam.  Doubt diverticulitis, cholecystitis, appendicitis, pyelonephritis, pancreatitis, mesenteric ischemia. Given her history of recurrent STIs, pelvic exam was performed and aside from vaginal bleeding associated with her menstrual cycle was grossly unremarkable.  No indication for emergent pelvic ultrasound at this time as she has a negative pregnancy test so doubt ectopic pregnancy, spontaneous abortion, and pain is bilateral so lower suspicion for ovarian torsion.  Will give Bentyl and Toradol plan to reassess.  Wet prep and urinalysis are pending.  Patient care transferred to PA Joy at the end of my shift to follow-up on wet prep and urinalysis.  Plan for disposition to home with ibuprofen for pain control and OB/GYN follow-up.  Patient presentation, ED course, and plan of care discussed with review of all pertinent labs and imaging. Please see his/her note for further details regarding further ED course and disposition.   Final Clinical Impressions(s) / ED Diagnoses   Final diagnoses:  None    ED Discharge Orders    None       McDonald, Mia A, PA-C 08/21/19 [id number]    Derwood KaplanNanavati, Ankit, MD 08/24/19 [phone]

## 2019-08-23 LAB — GC/CHLAMYDIA PROBE AMP (~~LOC~~) NOT AT ARMC
Chlamydia: NEGATIVE
Neisseria Gonorrhea: NEGATIVE

## 2020-03-29 ENCOUNTER — Emergency Department (HOSPITAL_COMMUNITY)
Admission: EM | Admit: 2020-03-29 | Discharge: 2020-03-29 | Disposition: A | Discharge status: Home / Self Care | Payer: Self-pay | Attending: Emergency Medicine | Admitting: Emergency Medicine

## 2020-03-29 ENCOUNTER — Other Ambulatory Visit: Payer: Self-pay

## 2020-03-29 ENCOUNTER — Encounter (HOSPITAL_COMMUNITY): Payer: Self-pay | Admitting: Emergency Medicine

## 2020-03-29 DIAGNOSIS — F17210 Nicotine dependence, cigarettes, uncomplicated: Secondary | ICD-10-CM | POA: Insufficient documentation

## 2020-03-29 DIAGNOSIS — L723 Sebaceous cyst: Secondary | ICD-10-CM | POA: Insufficient documentation

## 2020-03-29 MED ORDER — DOXYCYCLINE HYCLATE 100 MG PO CAPS: 100.0000 mg | ORAL_CAPSULE | Freq: Two times a day (BID) | ORAL | 0 refills | Status: AC

## 2020-03-29 NOTE — Discharge Instructions (Addendum)
Apply warm compresses several times a day for 20 minutes at a time Take Doxycycline twice a day for one week Take Ibuprofen or Tylenol as needed for pain Please make an appointment with general surgery for possible cyst removal

## 2020-03-29 NOTE — ED Triage Notes (Signed)
Pt reports after her c-section 7 years ago had a bump/spot on right groin/lower abd. Denies issues until last week. Having pains esp with palpation or certain movements. Denies drainage.

## 2020-03-29 NOTE — ED Provider Notes (Signed)
  Face-to-face evaluation   History: She presents for evaluation of a painful bump of her right lower quadrant abdomen.  This bump has been present for years but only recently become tender and reddened.  No recent trauma known.  Physical exam: Overweight female who appears mildly uncomfortable.  Nodule right lower quadrant, and an intertriginous skin fold, approximately 2 x 3 cm, firm consistency, with mild overlying drainage.  No fluctuance.  No drainage or bleeding.  Mass in the abdominal wall right lower, is nonspecific but may represent a cystic structure, possible follicular related versus sebaceous.  Mass is circumscribed, and small.  No indication for ED drainage of this at this time.  Medical screening examination/treatment/procedure(s) were conducted as a shared visit with non-physician practitioner(s) and myself.  I personally evaluated the patient during the encounter    Mancel Bale, MD 04/03/20 2324

## 2020-04-02 NOTE — ED Provider Notes (Signed)
[facility]-EMERGENCY DEPT Provider Note   CSN: [id number] Arrival date & time: 03/29/20  1401     History Chief Complaint  Patient presents with  . Groin Pain    bump     [name] [name] is a 39 y.o. female who presents with a painful lump on the abdomen. She states she has had a painless lump over the R groin area for the past 7 years. She first noticed it after she had a C-section. Yesterday the area became painful, swollen, and red. The pain radiates across the abdomen. She denies any other symptoms including fever, chills, N/V/D, vaginal discharge, urinary symptoms. Nothing makes it better or worse. She came today to get it checked out to make sure she isn't at risk for an intraabdominal infection.   HPI     Past Medical History:  Diagnosis Date  . Chlamydia   . Gonorrhea   . Medical history non-contributory   . Trichomonas     There are no problems to display for this patient.   Past Surgical History:  Procedure Laterality Date  . CESAREAN SECTION  07/15/2012   Procedure: CESAREAN SECTION;  Surgeon: Kathreen Cosier, MD;  Location: [facility];  Service: Gynecology;  Laterality: N/A;     OB History    Gravida  1   Para  1   Term  1   Preterm  0   AB  0   Living  1     SAB  0   TAB  0   Ectopic  0   Multiple  0   Live Births  1           Family History  Problem Relation Age of Onset  . Diabetes Mother   . Heart disease Father   . Anesthesia problems Neg Hx     Social History   Tobacco Use  . Smoking status: Current Every Day Smoker    Packs/day: 0.25    Years: 10.00    Pack years: 2.50  . Smokeless tobacco: Never Used  Substance Use Topics  . Alcohol use: Yes    Comment: occ  . Drug use: Yes    Types: Marijuana, Cocaine    Comment: occasionally    Home Medications Prior to Admission medications   Medication Sig Start Date End Date Taking? Authorizing Provider  doxycycline (VIBRAMYCIN) 100 MG capsule  Take 1 capsule (100 mg total) by mouth 2 (two) times daily. 03/29/20   Bethel Born, PA-C    Allergies    Tylenol [acetaminophen]  Review of Systems   Review of Systems  Constitutional: Negative for chills and fever.  Gastrointestinal: Negative for abdominal pain, diarrhea, nausea and vomiting.  Genitourinary: Negative for difficulty urinating, dysuria, flank pain, frequency, pelvic pain, vaginal bleeding and vaginal discharge.  Skin:       +painful lump  All other systems reviewed and are negative.   Physical Exam Updated Vital Signs BP (!) 132/100 (BP Location: Right Arm)   Pulse 84   Temp 98.4 F (36.9 C) (Oral)   Resp 20   LMP 03/19/2020   SpO2 100%   Physical Exam Vitals and nursing note reviewed.  Constitutional:      General: She is not in acute distress.    Appearance: Normal appearance. She is well-developed. She is not ill-appearing.     Comments: Calm, cooperative. Well appearing  HENT:     Head: Normocephalic and atraumatic.  Eyes:  General: No scleral icterus.       Right eye: No discharge.        Left eye: No discharge.     Conjunctiva/sclera: Conjunctivae normal.     Pupils: Pupils are equal, round, and reactive to light.  Cardiovascular:     Rate and Rhythm: Normal rate.  Pulmonary:     Effort: Pulmonary effort is normal. No respiratory distress.  Abdominal:     General: There is no distension.     Palpations: Abdomen is soft.     Tenderness: There is no abdominal tenderness.  Genitourinary:    Comments: Tender, erythematous nodule over the right lower abdomen. No lymphadenopathy in the R groin Musculoskeletal:     Cervical back: Normal range of motion.  Skin:    General: Skin is warm and dry.  Neurological:     Mental Status: She is alert and oriented to person, place, and time.  Psychiatric:        Behavior: Behavior normal.     ED Results / Procedures / Treatments   Labs (all labs ordered are listed, but only abnormal results  are displayed) Labs Reviewed - No data to display  EKG None  Radiology No results found.  Procedures Procedures (including critical care time)  Medications Ordered in ED Medications - No data to display  ED Course  I have reviewed the triage vital signs and the nursing notes.  Pertinent labs & imaging results that were available during my care of the patient were reviewed by me and considered in my medical decision making (see chart for details).  39 year old female presents with a painful lump that previously had been painless for years. It is red, swollen, and tender. It does not appear consistent with a lymph node since it is higher up on the abdomen. It is likely a inflamed cyst. Informal bedside US was used which shows a fluid collection. Shared visit with Dr. Eulis Foster. Due to the location it was felt she would be better off seen by general surgery for cyst removal. In the meantime will do warm compresses and Doxy. She was given a referral and advised return if worsening.  MDM Rules/Calculators/A&P                       Final Clinical Impression(s) / ED Diagnoses Final diagnoses:  Inflamed epidermoid cyst of skin    Rx / DC Orders ED Discharge Orders         Ordered    doxycycline (VIBRAMYCIN) 100 MG capsule  2 times daily     03/29/20 1733           Recardo Evangelist, PA-C 04/02/20 1002    [place], [place], DO 04/02/20 1033

## 2021-10-08 ENCOUNTER — Emergency Department (HOSPITAL_COMMUNITY)
Admission: EM | Admit: 2021-10-08 | Discharge: 2021-10-08 | Disposition: A | Discharge status: Home / Self Care | Payer: Medicaid Other | Attending: Emergency Medicine | Admitting: Emergency Medicine

## 2021-10-08 ENCOUNTER — Encounter (HOSPITAL_COMMUNITY): Payer: Self-pay | Admitting: Emergency Medicine

## 2021-10-08 ENCOUNTER — Other Ambulatory Visit: Payer: Self-pay

## 2021-10-08 ENCOUNTER — Emergency Department (HOSPITAL_COMMUNITY): Payer: Medicaid Other

## 2021-10-08 DIAGNOSIS — G8911 Acute pain due to trauma: Secondary | ICD-10-CM | POA: Insufficient documentation

## 2021-10-08 DIAGNOSIS — W231XXA Caught, crushed, jammed, or pinched between stationary objects, initial encounter: Secondary | ICD-10-CM | POA: Insufficient documentation

## 2021-10-08 DIAGNOSIS — F17210 Nicotine dependence, cigarettes, uncomplicated: Secondary | ICD-10-CM | POA: Diagnosis not present

## 2021-10-08 DIAGNOSIS — Y92009 Unspecified place in unspecified non-institutional (private) residence as the place of occurrence of the external cause: Secondary | ICD-10-CM | POA: Diagnosis not present

## 2021-10-08 DIAGNOSIS — M79645 Pain in left finger(s): Secondary | ICD-10-CM | POA: Diagnosis present

## 2021-10-08 MED ORDER — IBUPROFEN 800 MG PO TABS
800.0000 mg | ORAL_TABLET | Freq: Once | ORAL | Status: AC
  Administered 2021-10-08: 800 mg via ORAL
  Filled 2021-10-08: qty 1

## 2021-10-08 NOTE — ED Provider Notes (Signed)
[facility]-EMERGENCY DEPT Provider Note   CSN: [id number] Arrival date & time: 10/08/21  1221     History Chief Complaint  Patient presents with   Finger Injury    [name] [name] is a 40 y.o. female who presents to the emergency department with left second finger pain that occurred yesterday after slamming her finger at home.  Her pain has been constant since onset which she rates moderate in severity.  It is worse with movement.  Pain was briefly improved with cold therapy.  She has not taken over the counter medications for her pain.  No weakness, numbness, bruising, erythema to the area.  She denies any other injury.  HPI     Past Medical History:  Diagnosis Date   Chlamydia    Gonorrhea    Medical history non-contributory    Trichomonas     There are no problems to display for this patient.   Past Surgical History:  Procedure Laterality Date   CESAREAN SECTION  07/15/2012   Procedure: CESAREAN SECTION;  Surgeon: Kathreen Cosier, MD;  Location: [facility];  Service: Gynecology;  Laterality: N/A;     OB History     Gravida  1   Para  1   Term  1   Preterm  0   AB  0   Living  1      SAB  0   IAB  0   Ectopic  0   Multiple  0   Live Births  1           Family History  Problem Relation Age of Onset   Diabetes Mother    Heart disease Father    Anesthesia problems Neg Hx     Social History   Tobacco Use   Smoking status: Every Day    Packs/day: 0.25    Years: 10.00    Pack years: 2.50    Types: Cigarettes   Smokeless tobacco: Never  Vaping Use   Vaping Use: Never used  Substance Use Topics   Alcohol use: Yes    Comment: occ   Drug use: Yes    Types: Marijuana, Cocaine    Comment: occasionally    Home Medications Prior to Admission medications   Medication Sig Start Date End Date Taking? Authorizing Provider  doxycycline (VIBRAMYCIN) 100 MG capsule Take 1 capsule (100 mg total) by mouth 2 (two) times  daily. 03/29/20   Bethel Born, PA-C    Allergies    Tylenol [acetaminophen]  Review of Systems   Review of Systems  All other systems reviewed and are negative.  Physical Exam Updated Vital Signs BP 113/85 (BP Location: Left Arm)   Pulse 89   Temp (!) 97.4 F (36.3 C) (Oral)   Resp 18   LMP 09/24/2021 (Approximate)   SpO2 100%   Physical Exam Vitals and nursing note reviewed.  Constitutional:      Appearance: Normal appearance.  HENT:     Head: Normocephalic and atraumatic.  Eyes:     General:        Right eye: No discharge.        Left eye: No discharge.     Conjunctiva/sclera: Conjunctivae normal.  Pulmonary:     Effort: Pulmonary effort is normal.  Musculoskeletal:     Comments: Left second DIP is tender to palpation.  She does have good strength with flexion extension.  Both extensor and flexor tendons are intact.  No obvious deformity  or open fractures.  No obvious swelling.  She does have a strong 2+ radial pulse on the left.  Good cap refill in the second left finger.  Skin:    General: Skin is warm and dry.     Findings: No rash.  Neurological:     General: No focal deficit present.     Mental Status: She is alert.  Psychiatric:        Mood and Affect: Mood normal.        Behavior: Behavior normal.    ED Results / Procedures / Treatments   Labs (all labs ordered are listed, but only abnormal results are displayed) Labs Reviewed - No data to display  EKG None  Radiology DG Finger Index Left  Result Date: 10/08/2021 CLINICAL DATA:  injury, swelling EXAM: LEFT INDEX FINGER 2+V COMPARISON:  None. FINDINGS: No evidence of fracture or joint malalignment. No significant arthropathy. Question soft tissue swelling. IMPRESSION: No evidence of acute fracture or joint malalignment. Electronically Signed   By: Feliberto Harts M.D.   On: 10/08/2021 13:22    Procedures Procedures   Medications Ordered in ED Medications  ibuprofen (ADVIL) tablet 800 mg  (has no administration in time range)    ED Course  I have reviewed the triage vital signs and the nursing notes.  Pertinent labs & imaging results that were available during my care of the patient were reviewed by me and considered in my medical decision making (see chart for details).    MDM Rules/Calculators/A&P                          [name] [name] is a 40 y.o. female who presents the emergency department with finger pain.  I have a low suspicion for significant ligamentous injury at this time.  X-ray was negative for any fractures, dislocations, or joint instability.  I will give her a finger splint for comfort.  800 mg ibuprofen given the department today.  I will have her take 600 mg of ibuprofen every 6 hours for pain control.  Strict turn precautions given.  She is safe for discharge.   Final Clinical Impression(s) / ED Diagnoses Final diagnoses:  Pain of finger of left hand    Rx / DC Orders ED Discharge Orders     None        Teressa Lower, [place] 10/08/21 1355    Mancel Bale, MD 10/09/21 1332

## 2021-10-08 NOTE — ED Triage Notes (Signed)
Reports jamming her finger yesterday, L index finger, reports pain w/ movement. Has not taken anything for pain. Slight distal swelling.

## 2021-10-08 NOTE — Discharge Instructions (Signed)
Your imaging was negative for any fracture dislocations.  Please take 600 mg of ibuprofen every 6 hours.  Apply finger splint for comfort.  Please return to the emergency department if you experience worsening pain, weakness in the finger, numbness in the finger, or any other concerns you might have.

## 2023-08-31 ENCOUNTER — Other Ambulatory Visit: Payer: Self-pay | Admitting: Family Medicine

## 2023-08-31 DIAGNOSIS — Z1231 Encounter for screening mammogram for malignant neoplasm of breast: Secondary | ICD-10-CM

## 2023-09-17 ENCOUNTER — Emergency Department (HOSPITAL_COMMUNITY): Payer: Medicaid Other

## 2023-09-17 ENCOUNTER — Other Ambulatory Visit: Payer: Self-pay

## 2023-09-17 ENCOUNTER — Emergency Department (HOSPITAL_COMMUNITY)
Admission: EM | Admit: 2023-09-17 | Discharge: 2023-09-17 | Disposition: A | Discharge status: Home / Self Care | Payer: Medicaid Other | Attending: Emergency Medicine | Admitting: Emergency Medicine

## 2023-09-17 ENCOUNTER — Encounter (HOSPITAL_COMMUNITY): Payer: Self-pay | Admitting: Emergency Medicine

## 2023-09-17 DIAGNOSIS — F17210 Nicotine dependence, cigarettes, uncomplicated: Secondary | ICD-10-CM | POA: Insufficient documentation

## 2023-09-17 DIAGNOSIS — S199XXA Unspecified injury of neck, initial encounter: Secondary | ICD-10-CM | POA: Diagnosis present

## 2023-09-17 DIAGNOSIS — S161XXA Strain of muscle, fascia and tendon at neck level, initial encounter: Secondary | ICD-10-CM | POA: Insufficient documentation

## 2023-09-17 DIAGNOSIS — X58XXXA Exposure to other specified factors, initial encounter: Secondary | ICD-10-CM | POA: Diagnosis not present

## 2023-09-17 MED ORDER — CYCLOBENZAPRINE HCL 10 MG PO TABS: 10.0000 mg | ORAL_TABLET | Freq: Two times a day (BID) | ORAL | 0 refills | Status: AC | PRN

## 2023-09-17 NOTE — Discharge Instructions (Signed)
We evaluated you for your neck pain.  Your symptoms are most likely due to a muscle sprain.  Your x-ray did not show any broken bones.  Please follow-up closely with your primary doctor. Please take Tylenol and Motrin for your symptoms at home.  You can take 1000 mg of Tylenol every 6 hours and 600 mg of ibuprofen every 6 hours as needed for your symptoms.  You can take these medicines together as needed, either at the same time, or alternating every 3 hours.  I have prescribed you a muscle relaxer.  Please take this as prescribed if needed.  Do not mix this with your tramadol as it could cause increased drowsiness.  Be careful when taking the muscle relaxer as well, please do not drive as it may make you very sleepy.  If you develop any new or worsening symptoms such as fevers or chills, severe pain, lack of feeling in your arm, weakness in your arm, fainting, severe headaches, or any other concerning symptoms please return to the emergency department.

## 2023-09-17 NOTE — ED Provider Notes (Signed)
[place] EMERGENCY DEPARTMENT AT [facility] Provider Note  CSN: [id number] Arrival date & time: 09/17/23 1018  Chief Complaint(s) Neck Pain  HPI [name] [name] is a 42 y.o. female without significant past medical history presenting to the emergency department with neck pain.  Patient reports that she has had neck pain for around 3 weeks now.  No trauma.  No fevers or chills.  No headaches.  No nausea or vomiting.  She reports pain goes down her left arm.  She reports the pain is primarily on the left side of her neck.  She saw her primary doctor who gave her tramadol but it did not help.   Past Medical History Past Medical History:  Diagnosis Date   Chlamydia    Gonorrhea    Medical history non-contributory    Trichomonas    There are no problems to display for this patient.  Home Medication(s) Prior to Admission medications   Medication Sig Start Date End Date Taking? Authorizing Provider  cyclobenzaprine (FLEXERIL) 10 MG tablet Take 1 tablet (10 mg total) by mouth 2 (two) times daily as needed for muscle spasms. 09/17/23  Yes Lonell Grandchild, MD  doxycycline (VIBRAMYCIN) 100 MG capsule Take 1 capsule (100 mg total) by mouth 2 (two) times daily. 03/29/20   Bethel Born, PA-C                                                                                                                                    Past Surgical History Past Surgical History:  Procedure Laterality Date   CESAREAN SECTION  07/15/2012   Procedure: CESAREAN SECTION;  Surgeon: Kathreen Cosier, MD;  Location: [facility];  Service: Gynecology;  Laterality: N/A;   Family History Family History  Problem Relation Age of Onset   Diabetes Mother    Heart disease Father    Anesthesia problems Neg Hx     Social History Social History   Tobacco Use   Smoking status: Every Day    Current packs/day: 0.25    Average packs/day: 0.3 packs/day for 10.0 years (2.5 ttl pk-yrs)    Types:  Cigarettes   Smokeless tobacco: Never  Vaping Use   Vaping status: Never Used  Substance Use Topics   Alcohol use: Yes    Comment: occ   Drug use: Yes    Types: Marijuana, Cocaine    Comment: occasionally   Allergies Tylenol [acetaminophen]  Review of Systems Review of Systems  All other systems reviewed and are negative.   Physical Exam Vital Signs  I have reviewed the triage vital signs BP (!) 122/96 (BP Location: Right Arm)   Pulse 76   Temp 99.1 F (37.3 C)   Resp 20   Ht 4\' 9"  (1.448 m)   Wt 73.5 kg   LMP 08/25/2023   SpO2 100%   BMI 35.06 kg/m  Physical Exam Vitals and nursing note reviewed.  Constitutional:      Appearance: Normal appearance.  HENT:     Head: Normocephalic and atraumatic.     Mouth/Throat:     Mouth: Mucous membranes are moist.  Eyes:     Conjunctiva/sclera: Conjunctivae normal.  Neck:     Comments: No midline cervical spine tenderness.  Left trapezius tenderness which reproduces the patient's pain.  Patient able to range neck up and down, left and right with no difficulty. Cardiovascular:     Rate and Rhythm: Normal rate.  Pulmonary:     Effort: Pulmonary effort is normal. No respiratory distress.  Abdominal:     General: Abdomen is flat.  Musculoskeletal:        General: No deformity.     Cervical back: Normal range of motion and neck supple. No rigidity.  Lymphadenopathy:     Cervical: No cervical adenopathy.  Skin:    General: Skin is warm and dry.     Capillary Refill: Capillary refill takes less than 2 seconds.  Neurological:     General: No focal deficit present.     Mental Status: She is alert. Mental status is at baseline.     Comments: No sensory deficit to light touch or weakness in the bilateral upper extremities.  Psychiatric:        Mood and Affect: Mood normal.        Behavior: Behavior normal.     ED Results and Treatments Labs (all labs ordered are listed, but only abnormal results are displayed) Labs  Reviewed - No data to display                                                                                                                        Radiology DG Cervical Spine Complete  Result Date: 09/17/2023 CLINICAL DATA:  Neck pain for the last 3 weeks EXAM: CERVICAL SPINE - COMPLETE 5 VIEW COMPARISON:  None Available. FINDINGS: On the lateral radiographs, the cervical spine is visualized from the skull base through C7. There is no evidence of cervical spine fracture or prevertebral soft tissue swelling. Straightening of the normal cervical lordosis. Intervertebral disc heights are preserved. No other significant bone abnormalities are identified. IMPRESSION: No acute osseous abnormality or advanced degenerative changes. Straightening of the normal cervical lordosis may reflect muscle spasm. Electronically Signed   By: Wiliam Ke M.D.   On: 09/17/2023 13:03    Pertinent labs & imaging results that were available during my care of the patient were reviewed by me and considered in my medical decision making (see MDM for details).  Medications Ordered in ED Medications - No data to display  Procedures Procedures  (including critical care time)  Medical Decision Making / ED Course   MDM:  42 year old female presenting to the emergency department neck pain.  Patient well-appearing, physical exam reassuring, no midline tenderness.  No weakness or neurologic symptoms.  Symptoms seem most consistent with muscle spasm.  She does have left trapezius tenderness.  Differential included fracture, meningitis, cord compression, nerve root compression, but overall very low concern for these given reassuring history and exam. She reports she was given tramadol which did not really help much.  Recommended physical therapy exercises at home, Tylenol, Motrin.  Will also  prescribe muscle relaxer.  Recommended follow-up with primary care doctor.  Advised not to mix tramadol and muscle relaxer.  Advised not to drive while taking muscle relaxer.  Recommend following up with primary care doctor for physical therapy referral if symptoms do not improve.        Imaging Studies ordered: I ordered imaging studies including XR neck On my interpretation imaging demonstrates no acute process I independently visualized and interpreted imaging. I agree with the radiologist interpretation   Medicines ordered and prescription drug management: Meds ordered this encounter  Medications   cyclobenzaprine (FLEXERIL) 10 MG tablet    Sig: Take 1 tablet (10 mg total) by mouth 2 (two) times daily as needed for muscle spasms.    Dispense:  20 tablet    Refill:  0    -I have reviewed the patients home medicines and have made adjustments as needed   Social Determinants of Health:  Diagnosis or treatment significantly limited by social determinants of health: obesity   Co morbidities that complicate the patient evaluation  Past Medical History:  Diagnosis Date   Chlamydia    Gonorrhea    Medical history non-contributory    Trichomonas       Dispostion: Disposition decision including need for hospitalization was considered, and patient discharged from emergency department.    Final Clinical Impression(s) / ED Diagnoses Final diagnoses:  Acute strain of neck muscle, initial encounter     This chart was dictated using voice recognition software.  Despite best efforts to proofread,  errors can occur which can change the documentation meaning.    Lonell Grandchild, MD 09/17/23 1323

## 2023-09-17 NOTE — ED Triage Notes (Signed)
Pt reports neck pain ongoing for the last 3 weeks. Seen by PCP and given script for flexeril. Pt states its not helping. Pt denies recent fevers. Pain is worse with movement. VSS.

## 2023-10-13 ENCOUNTER — Inpatient Hospital Stay: Admission: RE | Admit: 2023-10-13 | Payer: Medicaid Other | Source: Ambulatory Visit

## 2024-01-13 ENCOUNTER — Emergency Department (HOSPITAL_COMMUNITY)
Admission: EM | Admit: 2024-01-13 | Discharge: 2024-01-14 | Discharge status: Against Medical Advice | Payer: Medicaid Other | Attending: Emergency Medicine | Admitting: Emergency Medicine

## 2024-01-13 ENCOUNTER — Other Ambulatory Visit: Payer: Self-pay

## 2024-01-13 ENCOUNTER — Encounter (HOSPITAL_COMMUNITY): Payer: Self-pay | Admitting: Emergency Medicine

## 2024-01-13 ENCOUNTER — Emergency Department (HOSPITAL_COMMUNITY): Payer: Medicaid Other

## 2024-01-13 DIAGNOSIS — Z5321 Procedure and treatment not carried out due to patient leaving prior to being seen by health care provider: Secondary | ICD-10-CM | POA: Diagnosis not present

## 2024-01-13 DIAGNOSIS — R0789 Other chest pain: Secondary | ICD-10-CM | POA: Diagnosis present

## 2024-01-13 DIAGNOSIS — R1110 Vomiting, unspecified: Secondary | ICD-10-CM | POA: Insufficient documentation

## 2024-01-13 DIAGNOSIS — R059 Cough, unspecified: Secondary | ICD-10-CM | POA: Diagnosis not present

## 2024-01-13 LAB — CBC
HCT: 38.9 % (ref 36.0–46.0)
Hemoglobin: 11.8 g/dL — ABNORMAL LOW (ref 12.0–15.0)
MCH: 24.4 pg — ABNORMAL LOW (ref 26.0–34.0)
MCHC: 30.3 g/dL (ref 30.0–36.0)
MCV: 80.5 fL (ref 80.0–100.0)
Platelets: 256 10*3/uL (ref 150–400)
RBC: 4.83 MIL/uL (ref 3.87–5.11)
RDW: 16 % — ABNORMAL HIGH (ref 11.5–15.5)
WBC: 5.9 10*3/uL (ref 4.0–10.5)
nRBC: 0 % (ref 0.0–0.2)

## 2024-01-13 LAB — HCG, SERUM, QUALITATIVE: Preg, Serum: NEGATIVE

## 2024-01-13 NOTE — ED Triage Notes (Signed)
 Patient bib ems complaining of intermittent chest pressure in left side x1 day. Reports pain is worse when coughing. Reports sick contacts at home. Denies fevers.

## 2024-01-13 NOTE — ED Provider Triage Note (Signed)
 Emergency Medicine Provider Triage Evaluation Note  [name] [name] , a 43 y.o. female  was evaluated in triage.  Pt complains of chest pain and generally feeling unwell x few days.  + sick contacts at home.  Chest pain with coughing.  Vomited earlier today.  No prior cardiac hx.  Review of Systems  Positive: Cough, feeling unwell, chest pain Negative: feer  Physical Exam  BP (!) 136/93 (BP Location: Left Arm)   Pulse (!) 110   Temp 99.3 F (37.4 C) (Oral)   Resp 20   Ht 4\' 10"  (1.473 m)   Wt 74.8 kg   SpO2 99%   BMI 34.49 kg/m  Gen:   Awake, no distress   Resp:  Normal effort  MSK:   Moves extremities without difficulty  Other:  Sounds congested  Medical Decision Making  Medically screening exam initiated at 11:13 PM.  Appropriate orders placed.  Kade Rickels was informed that the remainder of the evaluation will be completed by another provider, this initial triage assessment does not replace that evaluation, and the importance of remaining in the ED until their evaluation is complete.  Chest pain.  Recent URI symptoms and sick contacts.  EKG, labs, CXR, RVP.   Garlon Hatchet, PA-C 01/13/24 [phone]

## 2024-01-14 LAB — BASIC METABOLIC PANEL
Anion gap: 17 — ABNORMAL HIGH (ref 5–15)
BUN: 7 mg/dL (ref 6–20)
CO2: 20 mmol/L — ABNORMAL LOW (ref 22–32)
Calcium: 10 mg/dL (ref 8.9–10.3)
Chloride: 99 mmol/L (ref 98–111)
Creatinine, Ser: 0.9 mg/dL (ref 0.44–1.00)
GFR, Estimated: >60 mL/min (ref >60)
Glucose, Bld: 114 mg/dL — ABNORMAL HIGH (ref 70–99)
Potassium: 3.8 mmol/L (ref 3.5–5.1)
Sodium: 136 mmol/L (ref 135–145)

## 2024-01-14 LAB — RESP PANEL BY RT-PCR (RSV, FLU A&B, COVID)  RVPGX2
Influenza A by PCR: NEGATIVE
Influenza B by PCR: NEGATIVE
Resp Syncytial Virus by PCR: NEGATIVE
SARS Coronavirus 2 by RT PCR: NEGATIVE

## 2024-01-14 LAB — TROPONIN I (HIGH SENSITIVITY): Troponin I (High Sensitivity): 3 ng/L (ref <18)

## 2024-01-14 NOTE — ED Notes (Signed)
 Pt called for vitals to be reassessed and blood work. No response. No sight of pt in lobby area.

## 2024-09-28 ENCOUNTER — Other Ambulatory Visit: Payer: Self-pay | Admitting: Family Medicine

## 2024-09-28 DIAGNOSIS — Z1231 Encounter for screening mammogram for malignant neoplasm of breast: Secondary | ICD-10-CM

## 2024-10-27 ENCOUNTER — Ambulatory Visit
